# Patient Record
Sex: Female | Born: 1985 | Race: White | Hispanic: No | Marital: Married | Smoking: Never smoker
Health system: Southern US, Community
[De-identification: ages and names within clinical notes are randomized; demographics above are authoritative.]

## PROBLEM LIST (undated history)

## (undated) DIAGNOSIS — F419 Anxiety disorder, unspecified: Secondary | ICD-10-CM

## (undated) DIAGNOSIS — R000 Tachycardia, unspecified: Secondary | ICD-10-CM

## (undated) DIAGNOSIS — J45909 Unspecified asthma, uncomplicated: Secondary | ICD-10-CM

## (undated) DIAGNOSIS — D649 Anemia, unspecified: Secondary | ICD-10-CM

## (undated) DIAGNOSIS — T7840XA Allergy, unspecified, initial encounter: Secondary | ICD-10-CM

## (undated) HISTORY — DX: Unspecified asthma, uncomplicated: J45.909

## (undated) HISTORY — DX: Anxiety disorder, unspecified: F41.9

## (undated) HISTORY — DX: Anemia, unspecified: D64.9

## (undated) HISTORY — DX: Allergy, unspecified, initial encounter: T78.40XA

## (undated) HISTORY — PX: WISDOM TOOTH EXTRACTION: SHX21

---

## 2014-09-29 LAB — OB RESULTS CONSOLE GC/CHLAMYDIA
Chlamydia: NEGATIVE
Gonorrhea: NEGATIVE

## 2014-10-11 ENCOUNTER — Ambulatory Visit (INDEPENDENT_AMBULATORY_CARE_PROVIDER_SITE_OTHER): Payer: BC Managed Care – PPO | Admitting: Family Medicine

## 2014-10-11 ENCOUNTER — Encounter: Payer: Self-pay | Admitting: Family Medicine

## 2014-10-11 ENCOUNTER — Other Ambulatory Visit: Payer: Self-pay | Admitting: Family Medicine

## 2014-10-11 ENCOUNTER — Encounter (INDEPENDENT_AMBULATORY_CARE_PROVIDER_SITE_OTHER): Payer: Self-pay

## 2014-10-11 VITALS — BP 102/64 | HR 138 | Temp 97.8°F | Resp 18 | Ht 60.0 in | Wt 121.9 lb

## 2014-10-11 DIAGNOSIS — I471 Supraventricular tachycardia: Secondary | ICD-10-CM | POA: Diagnosis not present

## 2014-10-11 DIAGNOSIS — J4520 Mild intermittent asthma, uncomplicated: Secondary | ICD-10-CM | POA: Insufficient documentation

## 2014-10-11 DIAGNOSIS — J4530 Mild persistent asthma, uncomplicated: Secondary | ICD-10-CM

## 2014-10-11 DIAGNOSIS — R000 Tachycardia, unspecified: Secondary | ICD-10-CM

## 2014-10-11 DIAGNOSIS — G43009 Migraine without aura, not intractable, without status migrainosus: Secondary | ICD-10-CM | POA: Insufficient documentation

## 2014-10-11 NOTE — Progress Notes (Signed)
Name: Autumn Todd   MRN: [id number]    DOB: 1985/10/10   Date:10/11/2014       Progress Note  Subjective  Chief Complaint  Chief Complaint  Patient presents with  . Tachycardia    patient was seen at her OB/GYN about 2 wks ago and was told she needed an EKG, due to increased heart rate.    HPI  Autumn Todd is a pleasant 28yo female G1P0 at about 10 weeks of her pregnancy who presents today with concerns of asymptomatic tachycardia found during her routine obstetric visit. First trimester lab work has been normal other than no previous immunity to varicella and one other virus. Her tachycardia is not associated with any symptoms. Autumn Todd does not recall any personal or family history of cardiac disease or thyroid disorder. She reports having a normal TSH about 1-2 years ago on routine lab work. She denies chest pain, worsening shortness of breath, dizziness, palpitation sensations, focal neurological deficits. She is weaning off of her Nortryptiline which she uses for chronic headaches and has taken herself off of Dulera and NSAIDs and her OCPs. She uses her PNV, anti-histamine daily. She also has a rescue albuterol inhaler which she has not needed recently. Her pregnancy has been uneventful thus far aside from some first trimester nausea which is resolving.  Patient Active Problem List   Diagnosis Date Noted  . Cephalalgia 10/11/2014  . Asthma, mild persistent 10/11/2014    History  Substance Use Topics  . Smoking status: Never Smoker   . Smokeless tobacco: Not on file  . Alcohol Use: No     Current outpatient prescriptions:  .  albuterol (PROAIR HFA) 108 (90 BASE) MCG/ACT inhaler, Inhale 2 puffs into the lungs every 6 (six) hours as needed., Disp: , Rfl:  .  Doxylamine-Pyridoxine (DICLEGIS PO), Take by mouth., Disp: , Rfl:  .  loratadine (CLARITIN) 10 MG tablet, Take 1 tablet by mouth daily., Disp: , Rfl:  .  mometasone-formoterol (DULERA) 200-5 MCG/ACT AERO, Inhale 2  puffs into the lungs 2 (two) times daily., Disp: , Rfl:  .  PRENATAL 28-0.8 MG TABS, Take 1 tablet by mouth daily., Disp: , Rfl:   Past Surgical History  Procedure Laterality Date  . Wisdom tooth extraction      Family History  Problem Relation Age of Onset  . Cancer Maternal Grandfather     No Known Allergies   Review of Systems  CONSTITUTIONAL: No significant weight changes, fever, chills, weakness or fatigue.  HEENT:  - Eyes: No visual changes.  - Ears: No auditory changes. No pain.  - Nose: No sneezing, congestion, runny nose. - Throat: No sore throat. No changes in swallowing. SKIN: No rash or itching.  CARDIOVASCULAR: No chest pain, chest pressure or chest discomfort. No edema. RESPIRATORY: No shortness of breath, cough or sputum.  GASTROINTESTINAL: No anorexia, nausea, vomiting. No changes in bowel habits. No abdominal pain or blood.  GENITOURINARY: No dysuria. No frequency. No discharge.  NEUROLOGICAL: No headache, dizziness, syncope, paralysis, ataxia, numbness or tingling in the extremities. No memory changes. No change in bowel or bladder control.  MUSCULOSKELETAL: No joint pain. No muscle pain. HEMATOLOGIC: No anemia, bleeding or bruising.  LYMPHATICS: No enlarged lymph nodes.  PSYCHIATRIC: No change in mood. No change in sleep pattern.  ENDOCRINOLOGIC: No reports of sweating, cold or heat intolerance. No polyuria or polydipsia.    Objective  BP 102/64 mmHg  Pulse 138  Temp(Src) 97.8 F (36.6 C) (Oral)  Resp 18  Ht 5' (1.524 m)  Wt 121 lb 14.4 oz (55.293 kg)  BMI 23.81 kg/m2  SpO2 98%  LMP 08/03/2014 Body mass index is 23.81 kg/(m^2).  Physical Exam  Constitutional: Patient appears well-developed and well-nourished. In no distress.  HEENT:  - Head: Normocephalic and atraumatic.  - Ears: Bilateral TMs gray, no erythema or effusion - Nose: Nasal mucosa moist - Mouth/Throat: Oropharynx is clear and moist. No tonsillar hypertrophy or erythema. No post  nasal drainage.  - Eyes: Conjunctivae clear, EOM movements normal. PERRLA. No scleral icterus. No exophthalamos.  Neck: Normal range of motion. Neck supple. No JVD present. No thyromegaly present.  Cardiovascular: Sinus tachycardia without murmur heard.  Pulmonary/Chest: Effort normal and breath sounds at baseline although she does have some end expiratory rhonchi in mid to upper lung fields. No respiratory distress. Musculoskeletal: Normal range of motion bilateral UE and LE, no joint effusions. Peripheral vascular: Bilateral LE no edema. Neurological: CN II-XII grossly intact with no focal deficits. Alert and oriented to person, place, and time. Coordination, balance, strength, speech and gait are normal.  Skin: Skin is warm and dry. No rash noted. No erythema.  Psychiatric: Patient has a normal mood and affect. Behavior is normal in office today. Judgment and thought content normal in office today.   Assessment & Plan  1. Sinus tachycardia Autumn Todd was seen in my clinic on 10/11/2014 due to asymptomatic tachycardia in first trimester of pregnancy. An EKG was done in the office that showed sinus tachycardia with no acute abnormal findings. Review of vital signs within the past year indicates a heart rate range of 70-120, higher during acute illnesses. Routine first trimester lab work does not reveal anemia or other potential causative etiologies of her sinus tachycardia. Physical exam does not suggest thyroid disease or other occult etiologies although sinus tachycardia may or may not be related to her underlying asthma disease. Overall we discussed close monitoring for any concerning symptoms that may develop as she progresses through her pregnancy. No further work up is needed at this time.   - EKG 12-Lead

## 2014-10-21 ENCOUNTER — Other Ambulatory Visit: Payer: Self-pay | Admitting: Family Medicine

## 2014-11-04 DIAGNOSIS — R000 Tachycardia, unspecified: Secondary | ICD-10-CM | POA: Insufficient documentation

## 2014-11-04 DIAGNOSIS — I4711 Inappropriate sinus tachycardia, so stated: Secondary | ICD-10-CM | POA: Insufficient documentation

## 2014-11-14 ENCOUNTER — Encounter: Payer: Self-pay | Admitting: Family Medicine

## 2014-11-14 ENCOUNTER — Ambulatory Visit (INDEPENDENT_AMBULATORY_CARE_PROVIDER_SITE_OTHER): Payer: BC Managed Care – PPO | Admitting: Family Medicine

## 2014-11-14 ENCOUNTER — Other Ambulatory Visit: Payer: Self-pay | Admitting: Family Medicine

## 2014-11-14 VITALS — BP 100/68 | HR 129 | Temp 97.6°F | Resp 20 | Ht 60.0 in | Wt 127.0 lb

## 2014-11-14 DIAGNOSIS — J4531 Mild persistent asthma with (acute) exacerbation: Secondary | ICD-10-CM

## 2014-11-14 MED ORDER — BUDESONIDE-FORMOTEROL FUMARATE 160-4.5 MCG/ACT IN AERO: 2.0000 | INHALATION_SPRAY | Freq: Two times a day (BID) | RESPIRATORY_TRACT | Status: DC

## 2014-11-14 MED ORDER — PREDNISONE 10 MG PO TABS: 10.0000 mg | ORAL_TABLET | Freq: Two times a day (BID) | ORAL | Status: DC

## 2014-11-14 NOTE — Patient Instructions (Signed)

## 2014-11-14 NOTE — Progress Notes (Signed)
Name: Autumn Todd   MRN: [id number]    DOB: April 21, 1985   Date:11/14/2014       Progress Note  Subjective  Chief Complaint  Chief Complaint  Patient presents with  . Asthma    patient stated that she has been having some shortness of breath.   . Cough    HPI  Autumn Todd is a pleasant 29 year old female with a known history of Asthma mild persistent who is here today with concerns of uncontrolled asthma since her pregnancy. Symptoms include shortness of breath and cough. Due date May 10, 2015 and prior to this was maintaining control of her Asthma on prn inhaled albuterol.  Prior to that was on [facility] for maintenance but had not needed that for several months prior to becoming pregnant. Recently she had a upper URI and has had more shortness of breath and dry cough ever since. She was seen by her obstetrician who took her off of the [facility] and started on budesodine which she reports is not alleviating her symptoms. Not associated with fevers, chills, productive cough, chest pain, worsening LE edema.   Patient Active Problem List   Diagnosis Date Noted  . 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 Migraine without aura and without status migrainosus, not intractable 10/11/2014  . Asthma, mild persistent 10/11/2014  . Sinus tachycardia 10/11/2014    History  Substance Use Topics  . Smoking status: Never Smoker   . Smokeless tobacco: Not on file  . Alcohol Use: No     Current outpatient prescriptions:  .  albuterol (PROAIR HFA) 108 (90 BASE) MCG/ACT inhaler, Inhale 2 puffs into the lungs every 6 (six) hours as needed., Disp: , Rfl:  .  budesonide-formoterol (SYMBICORT) 160-4.5 MCG/ACT inhaler, Inhale 2 puffs into the lungs 2 (two) times daily., Disp: 1 Inhaler, Rfl: 1 .  DICLEGIS 10-10 MG TBEC, , Disp: , Rfl: 1 .  loratadine (CLARITIN) 10 MG tablet, Take 1 tablet by mouth daily., Disp: , Rfl:  .  predniSONE (DELTASONE) 10 MG tablet, Take 1 tablet (10 mg total)  by mouth 2 (two) times daily with a meal., Disp: 12 tablet, Rfl: 0 .  PRENATAL 28-0.8 MG TABS, Take 1 tablet by mouth daily., Disp: , Rfl:  .  PULMICORT FLEXHALER 90 MCG/ACT inhaler, Inhale 2 puffs into the lungs 2 (two) times daily., Disp: , Rfl: 0  Past Surgical History  Procedure Laterality Date  . Wisdom tooth extraction      Family History  Problem Relation Age of Onset  . Cancer Maternal Grandfather     No Known Allergies   Review of Systems  CONSTITUTIONAL: No significant weight changes, fever, chills, weakness or fatigue.  HEENT:  - Eyes: No visual changes.  - Ears: No auditory changes. No pain.  - Nose: No sneezing, congestion, runny nose. - Throat: No sore throat. No changes in swallowing. SKIN: No rash or itching.  CARDIOVASCULAR: No chest pain, chest pressure or chest discomfort. No palpitations or edema.  RESPIRATORY: Yes cough and shortness of breath. GASTROINTESTINAL: No anorexia, nausea, vomiting. No changes in bowel habits. No abdominal pain or blood.  GENITOURINARY: No dysuria. No frequency. No discharge.  NEUROLOGICAL: No headache, dizziness, syncope, paralysis, ataxia, numbness or tingling in the extremities. No memory changes. No change in bowel or bladder control.  MUSCULOSKELETAL: No joint pain. No muscle pain. HEMATOLOGIC: No anemia, bleeding or bruising.  LYMPHATICS: No enlarged lymph nodes.  PSYCHIATRIC: No change in mood. No change in  sleep pattern.  ENDOCRINOLOGIC: No reports of sweating, cold or heat intolerance. No polyuria or polydipsia.     Objective  BP 100/68 mmHg  Pulse 129  Temp(Src) 97.6 F (36.4 C) (Oral)  Resp 20  Ht 5' (1.524 m)  Wt 127 lb (57.607 kg)  BMI 24.80 kg/m2  SpO2 96%  LMP 08/03/2014 Body mass index is 24.8 kg/(m^2).  Physical Exam  Constitutional: Patient appears well-developed and well-nourished. In no distress.  HEENT:  - Head: Normocephalic and atraumatic.  - Ears: Bilateral TMs gray, no erythema or  effusion - Nose: Nasal mucosa moist - Mouth/Throat: Oropharynx is clear and moist. No tonsillar hypertrophy or erythema. No post nasal drainage.  - Eyes: Conjunctivae clear, EOM movements normal. PERRLA. No scleral icterus.  Neck: Normal range of motion. Neck supple. No JVD present. No thyromegaly present.  Cardiovascular: Normal rate, regular rhythm and normal heart sounds.  No murmur heard.  Pulmonary/Chest: Effort normal and breath sounds mildly decreased. No respiratory distress. Abdomen: Gravida abdomen Musculoskeletal: Normal range of motion bilateral UE and LE, no joint effusions. Peripheral vascular: Bilateral LE no edema. Neurological: CN II-XII grossly intact with no focal deficits. Alert and oriented to person, place, and time. Coordination, balance, strength, speech and gait are normal.  Skin: Skin is warm and dry. No rash noted. No erythema.  Psychiatric: Patient has a normal mood and affect. Behavior is normal in office today. Judgment and thought content normal in office today.    Assessment & Plan  1. Asthma, mild persistent, with acute exacerbation Vitals stable, pulse ox reassuring, lungs sound fairly clear questionable tightness. Category C medications, risks and benefits discussed with patient and she has decided on moving forward with using prednisone in efforts to get a better handle of her asthma symptoms. She will continue Pulmicort alone for now but if symptoms do not improve she will switch to Symbicort.   - budesonide-formoterol (SYMBICORT) 160-4.5 MCG/ACT inhaler; Inhale 2 puffs into the lungs 2 (two) times daily.  Dispense: 1 Inhaler; Refill: 1 - predniSONE (DELTASONE) 10 MG tablet; Take 1 tablet (10 mg total) by mouth 2 (two) times daily with a meal.  Dispense: 12 tablet; Refill: 0

## 2015-02-09 LAB — OB RESULTS CONSOLE RPR: RPR: NONREACTIVE

## 2015-02-17 ENCOUNTER — Encounter: Payer: Self-pay | Admitting: Family Medicine

## 2015-02-17 ENCOUNTER — Ambulatory Visit (INDEPENDENT_AMBULATORY_CARE_PROVIDER_SITE_OTHER): Payer: BC Managed Care – PPO | Admitting: Family Medicine

## 2015-02-17 VITALS — BP 112/68 | HR 125 | Temp 97.5°F | Resp 20 | Wt 141.9 lb

## 2015-02-17 DIAGNOSIS — J0100 Acute maxillary sinusitis, unspecified: Secondary | ICD-10-CM | POA: Diagnosis not present

## 2015-02-17 MED ORDER — PREDNISONE 10 MG PO TABS: 10.0000 mg | ORAL_TABLET | Freq: Two times a day (BID) | ORAL | Status: DC

## 2015-02-17 MED ORDER — AMOXICILLIN 875 MG PO TABS: 875.0000 mg | ORAL_TABLET | Freq: Two times a day (BID) | ORAL | Status: DC

## 2015-02-17 NOTE — Progress Notes (Signed)
Name: Autumn Todd   MRN: [id number]    DOB: 06-Nov-1985   Date:02/17/2015       Progress Note  Subjective  Chief Complaint  Chief Complaint  Patient presents with  . Sinusitis    HPI  Patient is here today with concerns regarding the following symptoms congestion, sneezing, sinus pressure and non productive cough that started 3 days ago.  Associated with fatigue. Has tried the following home remedies: patient has taken her allergy medication and is currently using her maintenance inhaler, Pulmicort. Has not needed rescue inhaler as she has not started having wheezing or chest tightness. She has had sinus and nasal symptoms for 3 days only but wanted to have something at her pharmacy in case her symptoms progress. She is in the early part of her 3rd trimester of her first pregnancy.   Past Medical History  Diagnosis Date  . Allergy   . Asthma   . Dizziness     Social History  Substance Use Topics  . Smoking status: Never Smoker   . Smokeless tobacco: Not on file  . Alcohol Use: No     Current outpatient prescriptions:  .  DICLEGIS 10-10 MG TBEC, , Disp: , Rfl: 1 .  loratadine (CLARITIN) 10 MG tablet, Take 1 tablet by mouth daily., Disp: , Rfl:  .  PRENATAL 28-0.8 MG TABS, Take 1 tablet by mouth daily., Disp: , Rfl:  .  PULMICORT FLEXHALER 90 MCG/ACT inhaler, Inhale 2 puffs into the lungs 2 (two) times daily., Disp: , Rfl: 0 .  albuterol (PROAIR HFA) 108 (90 BASE) MCG/ACT inhaler, Inhale 2 puffs into the lungs every 6 (six) hours as needed., Disp: , Rfl:   No Known Allergies  ROS  Positive for fatigue, nasal congestion, sinus pressure, ear fullness, cough as mentioned in HPI, otherwise all systems reviewed and are negative.  Objective  Filed Vitals:   02/17/15 1608  Pulse: 125  Temp: 97.5 F (36.4 C)  TempSrc: Oral  Resp: 18  Weight: 141 lb 14.4 oz (64.365 kg)  SpO2: 97%   Body mass index is 27.71 kg/(m^2).   Physical Exam  Constitutional: Patient  appears well-developed and well-nourished. In no acute distress. Is gravida, looks well. HEENT:  - Head: Normocephalic and atraumatic.  - Ears: RIGHT TM bulging with minimal clear exudate, LEFT TM bulging with minimal clear exudate.  - Nose: Nasal mucosa boggy and congested.  - Mouth/Throat: Oropharynx is moist with slight erythema of bilateral tonsils without hypertrophy or exudates. Post nasal drainage present.  - Eyes: Conjunctivae clear, EOM movements normal. PERRLA. No scleral icterus.  Neck: Normal range of motion. Neck supple. No JVD present. No thyromegaly present. No local lymphadenopathy. Cardiovascular: Regular rate, regular rhythm with no murmurs heard.  Pulmonary/Chest: Effort normal and breath sounds clear in all lung fields.  Psychiatric: Patient has a normal mood and affect. Behavior is normal in office today. Judgment and thought content normal in office today.   Assessment & Plan  1. Acute maxillary sinusitis, recurrence not specified Etiologies include allergic rhinitis with superimposed viral infection at this time. Instructed patient on increasing hydration, nasal saline spray, steam inhalation, Tylenol if tolerated and not contraindicated. If symptoms persist/worsen may consider antibiotic therapy. Use prednisone only if having asthma attack, we discussed risks vs benefits during her pregnancy.   - predniSONE (DELTASONE) 10 MG tablet; Take 1 tablet (10 mg total) by mouth 2 (two) times daily with a meal.  Dispense: 12 tablet; Refill: 0 - amoxicillin (AMOXIL)  875 MG tablet; Take 1 tablet (875 mg total) by mouth 2 (two) times daily.  Dispense: 20 tablet; Refill: 0

## 2015-04-11 LAB — OB RESULTS CONSOLE HIV ANTIBODY (ROUTINE TESTING): HIV: NONREACTIVE

## 2015-04-12 LAB — OB RESULTS CONSOLE GBS: STREP GROUP B AG: NEGATIVE

## 2015-04-27 ENCOUNTER — Inpatient Hospital Stay: Payer: BC Managed Care – PPO | Admitting: Certified Registered Nurse Anesthetist

## 2015-04-27 ENCOUNTER — Encounter: Payer: Self-pay | Admitting: *Deleted

## 2015-04-27 ENCOUNTER — Observation Stay: Payer: BC Managed Care – PPO

## 2015-04-27 ENCOUNTER — Inpatient Hospital Stay
Admission: RE | Admit: 2015-04-27 | Discharge: 2015-04-30 | DRG: 765 | Disposition: A | Discharge status: Home / Self Care | Payer: BC Managed Care – PPO | Source: Intra-hospital | Attending: Obstetrics and Gynecology | Admitting: Obstetrics and Gynecology

## 2015-04-27 ENCOUNTER — Encounter
Admission: RE | Disposition: A | Discharge status: Home / Self Care | Payer: Self-pay | Attending: Obstetrics and Gynecology

## 2015-04-27 DIAGNOSIS — Z3A38 38 weeks gestation of pregnancy: Secondary | ICD-10-CM

## 2015-04-27 DIAGNOSIS — O4103X0 Oligohydramnios, third trimester, not applicable or unspecified: Secondary | ICD-10-CM | POA: Diagnosis present

## 2015-04-27 DIAGNOSIS — O321XX0 Maternal care for breech presentation, not applicable or unspecified: Principal | ICD-10-CM | POA: Diagnosis present

## 2015-04-27 DIAGNOSIS — O9952 Diseases of the respiratory system complicating childbirth: Secondary | ICD-10-CM | POA: Diagnosis present

## 2015-04-27 DIAGNOSIS — O329XX0 Maternal care for malpresentation of fetus, unspecified, not applicable or unspecified: Secondary | ICD-10-CM | POA: Diagnosis present

## 2015-04-27 DIAGNOSIS — J45909 Unspecified asthma, uncomplicated: Secondary | ICD-10-CM | POA: Diagnosis present

## 2015-04-27 HISTORY — DX: Tachycardia, unspecified: R00.0

## 2015-04-27 LAB — CBC
HCT: 38.2 % (ref 35.0–47.0)
Hemoglobin: 12.4 g/dL (ref 12.0–16.0)
MCH: 29 pg (ref 26.0–34.0)
MCHC: 32.5 g/dL (ref 32.0–36.0)
MCV: 89.3 fL (ref 80.0–100.0)
PLATELETS: 268 10*3/uL (ref 150–440)
RBC: 4.28 MIL/uL (ref 3.80–5.20)
RDW: 13.9 % (ref 11.5–14.5)
WBC: 12.6 10*3/uL — AB (ref 3.6–11.0)

## 2015-04-27 LAB — TYPE AND SCREEN
ABO/RH(D): O POS
ANTIBODY SCREEN: NEGATIVE

## 2015-04-27 LAB — ABO/RH: ABO/RH(D): O POS

## 2015-04-27 SURGERY — Surgical Case
Anesthesia: Epidural

## 2015-04-27 MED ORDER — MEASLES, MUMPS & RUBELLA VAC ~~LOC~~ INJ
0.5000 mL | INJECTION | Freq: Once | SUBCUTANEOUS | Status: AC
  Administered 2015-04-30: 0.5 mL via SUBCUTANEOUS
  Filled 2015-04-27: qty 0.5

## 2015-04-27 MED ORDER — LIDOCAINE HCL (PF) 1 % IJ SOLN: 30.0000 mL | INTRAMUSCULAR | Status: DC | PRN

## 2015-04-27 MED ORDER — OXYTOCIN 40 UNITS IN LACTATED RINGERS INFUSION - SIMPLE MED
INTRAVENOUS | Status: AC
  Filled 2015-04-27: qty 1000

## 2015-04-27 MED ORDER — LIDOCAINE HCL (PF) 2 % IJ SOLN
INTRAMUSCULAR | Status: DC | PRN
  Administered 2015-04-27 (×8): 5 mL via EPIDURAL

## 2015-04-27 MED ORDER — FLUTICASONE PROPIONATE HFA 44 MCG/ACT IN AERO
2.0000 | INHALATION_SPRAY | Freq: Two times a day (BID) | RESPIRATORY_TRACT | Status: DC
  Filled 2015-04-27: qty 10.6

## 2015-04-27 MED ORDER — OXYCODONE HCL 5 MG PO TABS: 5.0000 mg | ORAL_TABLET | Freq: Once | ORAL | Status: DC | PRN

## 2015-04-27 MED ORDER — LACTATED RINGERS IV SOLN
INTRAVENOUS | Status: DC
  Administered 2015-04-27: 21:00:00 via INTRAVENOUS

## 2015-04-27 MED ORDER — CITRIC ACID-SODIUM CITRATE 334-500 MG/5ML PO SOLN: 30.0000 mL | ORAL | Status: DC

## 2015-04-27 MED ORDER — CITRIC ACID-SODIUM CITRATE 334-500 MG/5ML PO SOLN
30.0000 mL | ORAL | Status: DC | PRN
  Filled 2015-04-27: qty 15

## 2015-04-27 MED ORDER — CEFAZOLIN SODIUM-DEXTROSE 2-3 GM-% IV SOLR
2.0000 g | INTRAVENOUS | Status: AC
  Administered 2015-04-27: 2 g via INTRAVENOUS
  Filled 2015-04-27: qty 50

## 2015-04-27 MED ORDER — CEFAZOLIN SODIUM-DEXTROSE 2-3 GM-% IV SOLR
INTRAVENOUS | Status: DC | PRN
  Administered 2015-04-27: 2 g via INTRAVENOUS

## 2015-04-27 MED ORDER — ACETAMINOPHEN 325 MG PO TABS: 650.0000 mg | ORAL_TABLET | ORAL | Status: DC | PRN

## 2015-04-27 MED ORDER — OXYTOCIN 40 UNITS IN LACTATED RINGERS INFUSION - SIMPLE MED
INTRAVENOUS | Status: DC | PRN
  Administered 2015-04-27: 1000 mL via INTRAVENOUS

## 2015-04-27 MED ORDER — PHENYLEPHRINE HCL 10 MG/ML IJ SOLN
INTRAMUSCULAR | Status: DC | PRN
  Administered 2015-04-27 (×3): 100 ug via INTRAVENOUS

## 2015-04-27 MED ORDER — CEFAZOLIN SODIUM-DEXTROSE 2-3 GM-% IV SOLR
INTRAVENOUS | Status: AC
  Filled 2015-04-27: qty 50

## 2015-04-27 MED ORDER — FENTANYL CITRATE (PF) 100 MCG/2ML IJ SOLN
25.0000 ug | INTRAMUSCULAR | Status: DC | PRN
  Administered 2015-04-28: 50 ug via INTRAVENOUS

## 2015-04-27 MED ORDER — OXYCODONE HCL 5 MG/5ML PO SOLN: 5.0000 mg | Freq: Once | ORAL | Status: DC | PRN

## 2015-04-27 SURGICAL SUPPLY — 28 items
BENZOIN TINCTURE PRP APPL 2/3 (GAUZE/BANDAGES/DRESSINGS) ×3 IMPLANT
CANISTER SUCT 3000ML (MISCELLANEOUS) ×3 IMPLANT
CHLORAPREP W/TINT 26ML (MISCELLANEOUS) ×6 IMPLANT
CLOSURE WOUND 1/2 X4 (GAUZE/BANDAGES/DRESSINGS) ×1
DRSG TEGADERM 8X12 (GAUZE/BANDAGES/DRESSINGS) ×3 IMPLANT
DRSG TELFA 3X8 NADH (GAUZE/BANDAGES/DRESSINGS) ×3 IMPLANT
ELECT CAUTERY BLADE 6.4 (BLADE) ×3 IMPLANT
GAUZE SPONGE 4X4 12PLY STRL (GAUZE/BANDAGES/DRESSINGS) ×3 IMPLANT
GLOVE BIO SURGEON STRL SZ7 (GLOVE) ×9 IMPLANT
GLOVE INDICATOR 7.5 STRL GRN (GLOVE) ×9 IMPLANT
GOWN STRL REUS W/ TWL LRG LVL3 (GOWN DISPOSABLE) ×1 IMPLANT
GOWN STRL REUS W/ TWL XL LVL3 (GOWN DISPOSABLE) ×2 IMPLANT
GOWN STRL REUS W/TWL LRG LVL3 (GOWN DISPOSABLE) ×2
GOWN STRL REUS W/TWL XL LVL3 (GOWN DISPOSABLE) ×4
LIQUID BAND (GAUZE/BANDAGES/DRESSINGS) ×3 IMPLANT
NS IRRIG 1000ML POUR BTL (IV SOLUTION) ×3 IMPLANT
PACK C SECTION AR (MISCELLANEOUS) ×3 IMPLANT
PAD GROUND ADULT SPLIT (MISCELLANEOUS) ×3 IMPLANT
PAD OB MATERNITY 4.3X12.25 (PERSONAL CARE ITEMS) ×3 IMPLANT
PAD PREP 24X41 OB/GYN DISP (PERSONAL CARE ITEMS) ×3 IMPLANT
STRIP CLOSURE SKIN 1/2X4 (GAUZE/BANDAGES/DRESSINGS) ×2 IMPLANT
SUT MAXON ABS #0 GS21 30IN (SUTURE) ×3 IMPLANT
SUT PLAIN 2 0 (SUTURE) ×4
SUT PLAIN ABS 2-0 CT1 27XMFL (SUTURE) ×2 IMPLANT
SUT VIC AB 1 CT1 36 (SUTURE) ×9 IMPLANT
SUT VIC AB 2-0 CT1 36 (SUTURE) ×3 IMPLANT
SUT VIC AB 4-0 FS2 27 (SUTURE) ×3 IMPLANT
SYRINGE 10CC LL (SYRINGE) ×3 IMPLANT

## 2015-04-27 NOTE — Anesthesia Preprocedure Evaluation (Signed)
Anesthesia Evaluation  Patient identified by MRN, date of birth, ID band Patient awake    Reviewed: Allergy & Precautions, H&P , NPO status , Patient's Chart, lab work & pertinent test results  History of Anesthesia Complications Negative for: history of anesthetic complications  Airway Mallampati: III  TM Distance: >3 FB Neck ROM: full    Dental no notable dental hx. (+) Teeth Intact   Pulmonary neg shortness of breath, asthma ,    Pulmonary exam normal breath sounds clear to auscultation       Cardiovascular Exercise Tolerance: Good (-) hypertensionnegative cardio ROS Normal cardiovascular exam Rhythm:regular Rate:Normal     Neuro/Psych  Headaches, negative psych ROS   GI/Hepatic negative GI ROS, Neg liver ROS,   Endo/Other  negative endocrine ROS  Renal/GU negative Renal ROS  negative genitourinary   Musculoskeletal   Abdominal   Peds  Hematology negative hematology ROS (+)   Anesthesia Other Findings Past Medical History:   Allergy                                                      Asthma                                                       Dizziness                                                    Sinus tachycardia (HCC)                                      Migraine                                                    Past Surgical History:   WISDOM TOOTH EXTRACTION                                      BMI    Body Mass Index   30.07 kg/m 2      Reproductive/Obstetrics (+) Pregnancy                             Anesthesia Physical Anesthesia Plan  ASA: III  Anesthesia Plan: Spinal and Epidural   Post-op Pain Management:    Induction:   Airway Management Planned:   Additional Equipment:   Intra-op Plan:   Post-operative Plan:   Informed Consent: I have reviewed the patients History and Physical, chart, labs and discussed the procedure including the risks,  benefits and alternatives for the proposed anesthesia with the patient or authorized representative who has indicated his/her understanding and acceptance.   Dental Advisory Given  Plan  Discussed with: Anesthesiologist, CRNA and Surgeon  Anesthesia Plan Comments:         Anesthesia Quick Evaluation

## 2015-04-27 NOTE — OB Triage Note (Signed)
G1P0 pt sent from office for U/S and NST

## 2015-04-27 NOTE — Discharge Summary (Signed)
Obstetrical Discharge Summary  Date of Admission: 04/27/2015 Date of Discharge: 04/30/2015  Primary OB: [facility]  Gestational Age at Delivery: [id number]   Antepartum complications: None Reason for Admission: Newly diagnosed oligohydramnios and breech presentation at routine OB visit Date of Delivery: 04/30/2015  Delivered By: Cornelia Copaharlie Pickens, Jr MD Delivery Type: primary cesarean section, low transverse incision Intrapartum complications/course: None Anesthesia: CSE Placenta: Delivered and expressed via active management. Intact: yes. To pathology: yes.  Laceration: n/a Episiotomy: none Baby: Liveborn female, APGARs8/9, weight 2740 g.   Postpartum course: routine care following cesarean section Discharge Vital Signs:  Current Vital Signs 24h Vital Sign Ranges  T 98.1 F (36.7 C) Temp  Avg: 98.1 F (36.7 C)  Min: 97.8 F (36.6 C)  Max: 98.6 F (37 C)  BP 105/66 mmHg BP  Min: 105/66  Max: 108/61  HR 91 Pulse  Avg: 96  Min: 91  Max: 101  RR 16 Resp  Avg: 17  Min: 16  Max: 18  SaO2 98 % Not Delivered SpO2  Avg: 98.5 %  Min: 98 %  Max: 99 %       24 Hour I/O Current Shift I/O  Time Ins Outs        Patient Vitals for the past 6 hrs:  BP Temp Temp src Pulse Resp SpO2  04/30/15 0755 105/66 mmHg 98.1 F (36.7 C) Oral 91 16 98 %    Discharge Exam:  NAD Perineum: intact Abdomen: firm fundus below the umbilicus. Incision c/d/i with no signs/symptoms of infection.  Heart: RRR no MRGs Lungs: CTAB Ext: no evidence of DVT  Disposition: Home  Rh Immune globulin given: not applicable Rubella vaccine given: ordered Tdap vaccine given in AP or PP setting: yes Flu vaccine given in AP or PP setting: yes  Contraception: [place]  Prenatal/Postnatal Panel: O POS//Rubella Not immune//Varicella Not immune//RPR neg//HIV negative/HepB Surface Ag negative//pap no abnormalities (date: 2016)/Plan breastfeeding  Plan:  Autumn Todd was discharged to home in good condition. Follow-up  appointment with Dr. Vergie LivingPickens in 1 week for an incision check  Discharge Medications:   Medication List    STOP taking these medications        amoxicillin 875 MG tablet  Commonly known as:  AMOXIL     DICLEGIS 10-10 MG Tbec  Generic drug:  Doxylamine-Pyridoxine     loratadine 10 MG tablet  Commonly known as:  CLARITIN     predniSONE 10 MG tablet  Commonly known as:  DELTASONE      TAKE these medications        ibuprofen 600 MG tablet  Commonly known as:  ADVIL,MOTRIN  Take 1 tablet (600 mg total) by mouth every 6 (six) hours as needed for fever, headache, mild pain or cramping.     oxyCODONE 5 MG immediate release tablet  Commonly known as:  Oxy IR/ROXICODONE  Take 1 tablet (5 mg total) by mouth every 4 (four) hours as needed (pain scale 4-7).     PRENATAL 28-0.8 MG Tabs  Take 1 tablet by mouth daily.     PROAIR HFA 108 (90 Base) MCG/ACT inhaler  Generic drug:  albuterol  Inhale 2 puffs into the lungs every 6 (six) hours as needed.     PULMICORT FLEXHALER 90 MCG/ACT inhaler  Generic drug:  Budesonide  Inhale 2 puffs into the lungs 2 (two) times daily.         Tresea MallGLEDHILL,Dharma Pare, CNM   This patient and plan were discussed with Dr Elesa MassedWard 04/30/2015

## 2015-04-27 NOTE — Op Note (Addendum)
Operative Note   SURGERY DATE: 04/27/2015  PRE-OP DIAGNOSIS:  *Intrauterine pregnancy @ 38/1 *Oligohydramnios (AFI 4) *Malpresentation  POST-OP DIAGNOSIS: Same   PROCEDURE: primary low transverse cesarean section via pfannenstiel skin incision with double layer uterine closure  SURGEON: Surgeon(s) and Role:    * Elmira Bingharlie Dejon Lukas, MD - Primary  ASSISTANT: None  ANESTHESIA: CSE  ESTIMATED BLOOD LOSS: 600mL  DRAINS: 250mL UOP via indwelling foley  TOTAL IV FLUIDS: 1100mL crystalloid  VTE Prophylaxis: SCDs to bilateral lower extremities  ANTIBIOTICS: Two grams of Cefazolin were given., within 1 hour of skin incision. This was the 2nd dose due to long time obtaining anesthesia and 1st dose had just past the hour mark  SPECIMENS: placenta to pathology  COMPLICATIONS: none  FINDINGS: No intra-abdominal adhesions were noted. Grossly normal uterus, tubes and ovaries. clear amniotic fluid, complete breech, female infant, weight 2239gm, APGARs 8/9, intact placenta.  PROCEDURE IN DETAIL: The patient was taken to the operating room where anesthesia was administered and normal fetal heart tones were confirmed; this took longer than usual and fetal heart tones were checked approximately q5165m and they were always normal. She was then prepped and draped in the normal fashion in the dorsal supine position with a leftward tilt.  After a time out was performed, a pfannensteil  skin incision was made with the scalpel and carried through to the underlying layer of fascia. The fascia was then incised at the midline and this incision was extended laterally with the mayo scissors. Attention was turned to the superior aspect of the fascial incision which was grasped with the kocher clamps x 2, tented up and the rectus muscles were dissected off with the bovie. In a similar fashion the inferior aspect of the fascial incision was grasped with the kocher clamps, tented up and the rectus muscles dissected off  with the mayo scissors. The rectus muscles were then separated in the midline and the peritoneum was entered bluntly. The bladder blade was inserted and the vesicouterine peritoneum was identified, tented up and entered with the metzenbaum scissors. This incision was extended laterally and the bladder flap was created digitally. The bladder blade was reinserted.  A low transverse hysterotomy was made with the scalpel until the endometrial cavity was breached and the amniotic sac ruptured with the Allis clamp, yielding clear amniotic fluid. This incision was extended bluntly and then the lower extremities were grasped and brought through the incision via the Pinard maneuver, while rotating the sacrum anteriorly. Next, each shoulder was then brought through the incision and the body then wrapped in a moist towel. Using the Marceau-Smelli-Veit maneuver, the infants head was easily delivered. The cord was clamped x 2 and cut, and the infant was handed to the awaiting pediatricians and delayed cord clamping was not done. She was a [place] cord blood donor and this was obtained.  The placenta was then gradually expressed from the uterus and then the uterus was exteriorized and cleared of all clots and debris. The hysterotomy was repaired with a running suture of 1-0 monocryl. A second imbricating layer of 1-0 monocryl suture was then placed to achieve excellent hemostasis.   The uterus and adnexa were then returned to the abdomen, and the hysterotomy and all operative sites were reinspected and excellent hemostasis was noted after irrigation and suction of the abdomen with warm saline.  The peritoneum was closed with a running stitch of 3-0 vicryl. The fascia was reapproximated with 0 vicryl in a simple running fashion bilaterally. The  subcutaneous layer was then reapproximated with interrupted sutures of 2-0 plain gut, and the skin was then closed with 4-0 monocryl, in a subcuticular fashion and dermabond  The  patient  tolerated the procedure well. Sponge, lap, needle, and instrument counts were correct x 2. The patient was transferred to the recovery room awake, alert and breathing independently in stable condition.  Cornelia Copa MD [facility] OBGYN Pager [phone]

## 2015-04-27 NOTE — Transfer of Care (Signed)
Immediate Anesthesia Transfer of Care Note  Patient: Autumn DrownHeather Todd  Procedure(s) Performed: Procedure(s): CESAREAN SECTION (N/A)  Patient Location: PACU  Anesthesia Type:Epidural  Level of Consciousness: awake, alert , oriented and patient cooperative  Airway & Oxygen Therapy: Patient Spontanous Breathing  Post-op Assessment: Report given to RN and Post -op Vital signs reviewed and stable  Post vital signs: Reviewed and stable  Last Vitals:  Filed Vitals:   04/27/15 1915 04/27/15 2343  BP: 122/69 122/63  Pulse: 97 104  Temp: 36.7 C 36.7 C  Resp:  16    Complications: No apparent anesthesia complications

## 2015-04-27 NOTE — Anesthesia Procedure Notes (Signed)
Epidural Patient location during procedure: OB Start time: 04/27/2015 10:05 PM End time: 04/27/2015 10:09 PM  Staffing Anesthesiologist: Margorie JohnPISCITELLO, JOSEPH K Performed by: anesthesiologist   Preanesthetic Checklist Completed: patient identified, site marked, surgical consent, pre-op evaluation, timeout performed, IV checked, risks and benefits discussed and monitors and equipment checked  Epidural Patient position: sitting Prep: Betadine Patient monitoring: heart rate, continuous pulse ox and blood pressure Approach: midline Location: L4-L5 Injection technique: LOR saline  Needle:  Needle type: Tuohy  Needle gauge: 18 G Needle length: 9 cm and 9 Needle insertion depth: 5 cm Catheter type: closed end flexible Catheter size: 20 Guage Catheter at skin depth: 9 cm Test dose: negative and 1.5% lidocaine with Epi 1:200 K  Assessment Sensory level: T6 Events: blood not aspirated, injection not painful, no injection resistance, negative IV test and no paresthesia  Additional Notes   Patient tolerated the insertion well without complications.Reason for block:procedure for pain

## 2015-04-27 NOTE — Discharge Instructions (Addendum)
° °Cesarean Delivery, Care After °Refer to this sheet in the next few weeks. These instructions provide you with information on caring for yourself after your procedure. Your health care provider may also give you specific instructions. Your treatment has been planned according to current medical practices, but problems sometimes occur. Call your health care provider if you have any problems or questions after you go home. °HOME CARE INSTRUCTIONS  °· If you have an On-Q pump, remove it on the 5th day after your surgery, by removing the dressing/bandage and pulling the pump out. Cover the site where the pump strings came out with a band-aid, as needed. °· Only take over-the-counter or prescription medications as directed by your health care provider. °· Do not drink alcohol, especially if you are breastfeeding or taking medication to relieve pain. °· Do not  smoke tobacco. °· Continue to use good perineal care. Good perineal care includes: °¨ Wiping your perineum from front to back. °¨ Keeping your perineum clean. °· Check your surgical cut (incision) daily for increased redness, drainage, swelling, or separation of skin. °· Shower and clean your incision gently with soap and water every day, by letting warm and soapy water run over the incision, and then pat it dry. If your health care provider says it is okay, leave the incision uncovered. Use a bandage (dressing) if the incision is draining fluid or appears irritated. If the adhesive strips across the incision do not fall off within 7 days, carefully peel them off, after a shower. °· Hug a pillow when coughing or sneezing until your incision is healed. This helps to relieve pain. °· Do not use tampons, douches or have sexual intercourse, until your health care provider says it is okay. °· Wear a well-fitting bra that provides breast support. °· Limit wearing support panties or control-top hose. °· Drink enough fluids to keep your urine clear or pale  yellow. °· Eat high-fiber foods such as whole grain cereals and breads, brown rice, beans, and fresh fruits and vegetables every day. These foods may help prevent or relieve constipation. °· Resume activities such as climbing stairs, driving, lifting, exercising, or traveling as directed by your health care provider. °· Try to have someone help you with your household activities and your newborn for at least a few days after you leave the hospital. °· Rest as much as possible. Try to rest or take a nap when your newborn is sleeping. °· Increase your activities gradually. °· Do not lift more than 15lbs until directed by a provider. °· Keep all of your scheduled postpartum appointments. It is very important to keep your scheduled follow-up appointments. At these appointments, your health care provider will be checking to make sure that you are healing physically and emotionally. °SEEK MEDICAL CARE IF:  °· You are passing large clots from your vagina. Save any clots to show your health care provider. °· You have a foul smelling discharge from your vagina. °· You have trouble urinating. °· You are urinating frequently. °· You have pain when you urinate. °· You have a change in your bowel movements. °· You have increasing redness, pain, or swelling near your incision. °· You have pus draining from your incision. °· Your incision is separating. °· You have painful, hard, or reddened breasts. °· You have a severe headache. °· You have blurred vision or see spots. °· You feel sad or depressed. °· You have thoughts of hurting yourself or your newborn. °· You have questions about your   care, the care of your newborn, or medications. °· You are dizzy or light-headed. °· You have a rash. °· You have pain, redness, or swelling at the site of the removed intravenous access (IV) tube. °· You have nausea or vomiting. °· You stopped breastfeeding and have not had a menstrual period within 12 weeks of stopping. °· You are not  breastfeeding and have not had a menstrual period within 12 weeks of delivery. °· You have a fever. °SEEK IMMEDIATE MEDICAL CARE IF: °· You have persistent pain. °· You have chest pain. °· You have shortness of breath. °· You faint. °· You have leg pain. °· You have stomach pain. °· Your vaginal bleeding saturates 2 or more sanitary pads in 1 hour. °MAKE SURE YOU:  °· Understand these instructions. °· Will watch your condition. °· Will get help right away if you are not doing well or get worse. °Document Released: 12/22/2001 Document Revised: 08/16/2013 Document Reviewed: 11/27/2011 °ExitCare® Patient Information ©2015 [employer]. This information is not intended to replace advice given to you by your health care provider. Make sure you discuss any questions you have with your health care provider. ° ° °

## 2015-04-27 NOTE — H&P (Addendum)
Obstetrics Admission History & Physical  04/27/2015 - 1800 Primary OBGYN: [facility]  Chief Complaint: malpresentation and oligohydramnios  History of Present Illness  30 y.o. G1 @ 601w1d (Dating: EDC 1/25, LMP=11), with the above CC. Pregnancy complicated by: h/o sinus tachycardia and dizziness, h/o MI asthma, BMI 30, h/o migraines  Patient was seen in office today for regular PNV and was felt to be breech on leopolds. Bedside u/s confirms and also noted AFI of 4. Patient denies any LOF/gush of fluid, labor s/s or decreased FM. Pt has been NPO since clinic.   Review of Systems:  her 12 point review of systems is negative or as noted in the History of Present Illness.  PMHx:  Past Medical History  Diagnosis Date  . Allergy   . Asthma   . Dizziness   . Sinus tachycardia (HCC)   . Migraine    PSHx:  Past Surgical History  Procedure Laterality Date  . Wisdom tooth extraction     Medications: PNV, Pulmicort 180 bid, albuterol PRN, claritin Allergies: has No Known Allergies. OBHx:  OB History  Gravida Para Term Preterm AB SAB TAB Ectopic Multiple Living  1             # Outcome Date GA Lbr Len/2nd Weight Sex Delivery Anes PTL Lv  1 Current               GYNHx:  History of abnormal pap smears: No. History of STIs: No..             FHx:  Family History  Problem Relation Age of Onset  . Cancer Maternal Grandfather    Soc Hx:  Social History   Social History  . Marital Status: Married    Spouse Name: N/A  . Number of Children: 0  . Years of Education: N/A   Occupational History  . Teacher     [employer]   Social History Main Topics  . Smoking status: Never Smoker   . Smokeless tobacco: Not on file  . Alcohol Use: No  . Drug Use: No  . Sexual Activity:    Partners: Male   Other Topics Concern  . Not on file   Social History Narrative    Objective  AF VS normal and stable  EFM: 145 baseline, +accels, rare slight variables, mod var  Toco: q4473m  General: Well  nourished, well developed female in no acute distress.  Skin:  Warm and dry.  Cardiovascular: Regular rate and rhythm. Respiratory:  Clear to auscultation bilateral. Normal respiratory effort Abdomen: gravid, nttp Neuro/Psych:  Normal mood and affect.   Labs  none  Radiology none  Perinatal info  O pos/ Rubella  Not immune / Varicella Not immune/RPR neg/HIV Negative/HepB Surf Ag Negative/TDaP:yes / Flu shot: yes/pap neg 2015/  Assessment & Plan   30 y.o. G1P0 @ 641w1d with possible oligo and malpresentation *IUP:  RNST, category II tracing due to slight variables. Fetal status overall reassuring due to normal baseline, mod var and accels and c/w oligo *Oligo: formal u/s ordered. Pt and husband told that if still low on u/s, since she's term, would recommend proceeding with delivery. *Malpresentation: If needs to proceed with delivery tonight and still with oligo, recommended c-section, given inability to attempt ECV. Will base potential uterine incision on lie of the fetus.  *GBS: neg *MI asthma: on pulmicort bid and reports no need for rescue *[facility] and s/p negative holter and echo in august 2016.  On no meds. *Analgesia: no needs *Dispo: pending u/s  Cornelia Copa. MD [facility]BGYN Pager [phone]   ADDENDUM @ 1916 U/s shows AFI 4 and breech with spine maternal left and fundal placenta. D/w pt and husband recommend proceeding with c-section, which they are amenable to. Last PO of some water at 1500. Will get pre op labs and proceed when fine with anesthesia. Fetus category II due to occasional slight variables but still with accels and mod variability and normal baseline.  Cornelia Copa MD [facility]  Pager: [phone]

## 2015-04-28 ENCOUNTER — Encounter: Payer: Self-pay | Admitting: Obstetrics and Gynecology

## 2015-04-28 LAB — HEMATOCRIT: HEMATOCRIT: 30.7 % — AB (ref 35.0–47.0)

## 2015-04-28 MED ORDER — MENTHOL 3 MG MT LOZG
1.0000 | LOZENGE | OROMUCOSAL | Status: DC | PRN
  Filled 2015-04-28: qty 9

## 2015-04-28 MED ORDER — SENNOSIDES-DOCUSATE SODIUM 8.6-50 MG PO TABS
1.0000 | ORAL_TABLET | Freq: Every evening | ORAL | Status: DC | PRN
Start: 2015-04-28 — End: 2015-04-30

## 2015-04-28 MED ORDER — POLYETHYLENE GLYCOL 3350 17 G PO PACK
17.0000 g | PACK | Freq: Every day | ORAL | Status: DC
  Administered 2015-04-28 – 2015-04-30 (×2): 17 g via ORAL
  Filled 2015-04-28 (×3): qty 1

## 2015-04-28 MED ORDER — VARICELLA VIRUS VACCINE LIVE 1350 PFU/0.5ML IJ SUSR
0.5000 mL | Freq: Once | INTRAMUSCULAR | Status: AC
  Administered 2015-04-30: 0.5 mL via SUBCUTANEOUS
  Filled 2015-04-28 (×4): qty 0.5

## 2015-04-28 MED ORDER — DIBUCAINE 1 % RE OINT: 1.0000 "application " | TOPICAL_OINTMENT | RECTAL | Status: DC | PRN

## 2015-04-28 MED ORDER — IBUPROFEN 600 MG PO TABS
ORAL_TABLET | ORAL | Status: AC
  Administered 2015-04-28: 600 mg via ORAL
  Filled 2015-04-28: qty 1

## 2015-04-28 MED ORDER — FENTANYL CITRATE (PF) 100 MCG/2ML IJ SOLN
INTRAMUSCULAR | Status: AC
  Administered 2015-04-28: 50 ug via INTRAVENOUS
  Filled 2015-04-28: qty 2

## 2015-04-28 MED ORDER — LACTATED RINGERS IV SOLN
INTRAVENOUS | Status: DC
  Administered 2015-04-28: 05:00:00 via INTRAVENOUS

## 2015-04-28 MED ORDER — ALBUTEROL SULFATE (2.5 MG/3ML) 0.083% IN NEBU: 3.0000 mL | INHALATION_SOLUTION | Freq: Four times a day (QID) | RESPIRATORY_TRACT | Status: DC | PRN

## 2015-04-28 MED ORDER — OXYCODONE HCL 5 MG PO TABS
10.0000 mg | ORAL_TABLET | ORAL | Status: DC | PRN
  Administered 2015-04-28 – 2015-04-30 (×9): 10 mg via ORAL
  Filled 2015-04-28 (×9): qty 2

## 2015-04-28 MED ORDER — SIMETHICONE 80 MG PO CHEW
80.0000 mg | CHEWABLE_TABLET | Freq: Two times a day (BID) | ORAL | Status: DC
  Administered 2015-04-28 – 2015-04-30 (×5): 80 mg via ORAL
  Filled 2015-04-28 (×5): qty 1

## 2015-04-28 MED ORDER — ALBUTEROL SULFATE HFA 108 (90 BASE) MCG/ACT IN AERS
2.0000 | INHALATION_SPRAY | Freq: Four times a day (QID) | RESPIRATORY_TRACT | Status: DC | PRN
  Filled 2015-04-28: qty 6.7

## 2015-04-28 MED ORDER — LANOLIN HYDROUS EX OINT: 1.0000 "application " | TOPICAL_OINTMENT | CUTANEOUS | Status: DC | PRN

## 2015-04-28 MED ORDER — PRENATAL MULTIVITAMIN CH
1.0000 | ORAL_TABLET | Freq: Every day | ORAL | Status: DC
Start: 2015-04-28 — End: 2015-04-30
  Administered 2015-04-28 – 2015-04-30 (×3): 1 via ORAL
  Filled 2015-04-28 (×3): qty 1

## 2015-04-28 MED ORDER — OXYTOCIN 10 UNIT/ML IJ SOLN
2.5000 [IU]/h | INTRAVENOUS | Status: AC
  Filled 2015-04-28: qty 4

## 2015-04-28 MED ORDER — IBUPROFEN 600 MG PO TABS
600.0000 mg | ORAL_TABLET | Freq: Four times a day (QID) | ORAL | Status: DC | PRN
Start: 2015-04-28 — End: 2015-04-30
  Administered 2015-04-28 – 2015-04-30 (×10): 600 mg via ORAL
  Filled 2015-04-28 (×10): qty 1

## 2015-04-28 MED ORDER — OXYCODONE HCL 5 MG PO TABS
ORAL_TABLET | ORAL | Status: AC
  Filled 2015-04-28: qty 1

## 2015-04-28 MED ORDER — ACETAMINOPHEN 325 MG PO TABS: 650.0000 mg | ORAL_TABLET | ORAL | Status: DC | PRN

## 2015-04-28 MED ORDER — WITCH HAZEL-GLYCERIN EX PADS: 1.0000 "application " | MEDICATED_PAD | CUTANEOUS | Status: DC | PRN

## 2015-04-28 MED ORDER — LORATADINE 10 MG PO TABS
10.0000 mg | ORAL_TABLET | Freq: Every day | ORAL | Status: DC
  Administered 2015-04-28 – 2015-04-30 (×3): 10 mg via ORAL
  Filled 2015-04-28 (×3): qty 1

## 2015-04-28 MED ORDER — DIPHENHYDRAMINE HCL 25 MG PO CAPS: 25.0000 mg | ORAL_CAPSULE | Freq: Four times a day (QID) | ORAL | Status: DC | PRN

## 2015-04-28 MED ORDER — OXYCODONE HCL 5 MG PO TABS
5.0000 mg | ORAL_TABLET | ORAL | Status: DC | PRN
  Administered 2015-04-28 – 2015-04-30 (×4): 5 mg via ORAL
  Filled 2015-04-28 (×3): qty 1

## 2015-04-28 NOTE — Anesthesia Postprocedure Evaluation (Signed)
Anesthesia Post Note  Patient: Autumn DrownHeather Todd  Procedure(s) Performed: Procedure(s) (LRB): CESAREAN SECTION (N/A)  Patient location during evaluation: Mother Baby Anesthesia Type: Epidural Level of consciousness: awake and alert, oriented and patient cooperative Pain management: satisfactory to patient Vital Signs Assessment: post-procedure vital signs reviewed and stable Respiratory status: respiratory function stable Cardiovascular status: stable Postop Assessment: no headache, no backache, patient able to bend at knees, no signs of nausea or vomiting and adequate PO intake Anesthetic complications: no    Last Vitals:  Filed Vitals:   04/28/15 0453 04/28/15 0554  BP: 107/63 104/48  Pulse: 105 104  Temp: 36.9 C 37 C  Resp: 21 21    Last Pain:  Filed Vitals:   04/28/15 0554  PainSc: 4                  Dniyah Grant,  Rosanne SackKasey A

## 2015-04-28 NOTE — Progress Notes (Signed)
Admit Date: 04/27/2015 Today's Date: 04/28/2015  Subjective: Postpartum Day 1: Cesarean Delivery for Breech Patient reports incisional pain and tolerating PO.    Objective: Vital signs in last 24 hours: Temp:  [98.1 F (36.7 C)-99 F (37.2 C)] 98.3 F (36.8 C) (01/13 0759) Pulse Rate:  [97-218] 101 (01/13 0759) Resp:  [16-21] 21 (01/13 0554) BP: (103-130)/(48-88) 108/49 mmHg (01/13 0759) SpO2:  [97 %-99 %] 99 % (01/13 0759) Weight:  [154 lb (69.854 kg)] 154 lb (69.854 kg) (01/12 1915)  Physical Exam:  General: alert, cooperative and no distress Lochia: appropriate Uterine Fundus: firm Incision: dressing dry/clean DVT Evaluation: No evidence of DVT seen on physical exam.   Recent Labs  04/27/15 1914 04/28/15 0732  HGB 12.4  --   HCT 38.2 30.7*    Assessment/Plan: Status post Cesarean section. Doing well postoperatively.  Continue current care. Breast feeding Plans Minipill Needs Rubella, Varicella Remove foley, ambulate, advance diet  Autumn Todd PAUL 04/28/2015, 9:00 AM

## 2015-04-29 ENCOUNTER — Encounter: Payer: Self-pay | Admitting: Obstetrics and Gynecology

## 2015-04-29 LAB — RPR: RPR: NONREACTIVE

## 2015-04-29 NOTE — Progress Notes (Signed)
Admit Date: 04/27/2015 Today's Date: 04/29/2015  Subjective: Postpartum Day 2: Cesarean Delivery for Breech Patient reports decreased incisional pain, tolerating PO, and voiding without difficulty.    Objective: Vital signs in last 24 hours: Temp:  [97.6 F (36.4 C)-98.6 F (37 C)] 97.6 F (36.4 C) (01/14 0756) Pulse Rate:  [74-94] 94 (01/14 0756) Resp:  [18-20] 20 (01/14 0756) BP: (105-111)/(58-74) 105/64 mmHg (01/14 0756) SpO2:  [98 %-99 %] 99 % (01/14 0756)  Physical Exam:  General: alert, cooperative and no distress Lochia: appropriate Uterine Fundus: firm Incision: no drainage, no s/s of infection, no dehiscence DVT Evaluation: No evidence of DVT seen on physical exam.   Recent Labs  04/27/15 1914 04/28/15 0732  HGB 12.4  --   HCT 38.2 30.7*    Assessment/Plan: Status post Cesarean section. Doing well postoperatively.  Continue current care. Breast feeding Plans Minip[place] Needs Rubella, Varicella   Autumn Todd 04/29/2015, 12:44 PM

## 2015-04-29 NOTE — Progress Notes (Signed)
Patient discouraged about amount of milk she is producing to feed baby at this time. Patient was educated on when breast milk usually comes in and cluster feedings. She asked about different options for feeding and satisfying baby. Patient already uses a nipple shield and a breast pump. SNS and using a curved tip syringe was offered and explained. Patient verbalizes understanding but states that she still wants to try formula in a bottle since baby will "use a bottle when [patient] goes back to school." I explained that it may be harder to return to breast after given a bottle. Patient verbalizes understanding but still opted for formula in a bottle. Patient will continue to pump every 3 hours and give baby what is pumped along with formula in a bottle.

## 2015-04-30 LAB — CBC
HCT: 29.4 % — ABNORMAL LOW (ref 35.0–47.0)
HEMOGLOBIN: 9.7 g/dL — AB (ref 12.0–16.0)
MCH: 29.3 pg (ref 26.0–34.0)
MCHC: 33.1 g/dL (ref 32.0–36.0)
MCV: 88.5 fL (ref 80.0–100.0)
Platelets: 238 10*3/uL (ref 150–440)
RBC: 3.32 MIL/uL — ABNORMAL LOW (ref 3.80–5.20)
RDW: 14 % (ref 11.5–14.5)
WBC: 9.2 10*3/uL (ref 3.6–11.0)

## 2015-04-30 MED ORDER — IBUPROFEN 600 MG PO TABS: 600.0000 mg | ORAL_TABLET | Freq: Four times a day (QID) | ORAL | Status: DC | PRN

## 2015-04-30 MED ORDER — NORETHINDRONE ACETATE 5 MG PO TABS: 2.5000 mg | ORAL_TABLET | Freq: Every day | ORAL | Status: DC

## 2015-04-30 MED ORDER — NORETHINDRONE ACETATE 5 MG PO TABS: 5.0000 mg | ORAL_TABLET | Freq: Every day | ORAL | Status: DC

## 2015-04-30 MED ORDER — OXYCODONE HCL 5 MG PO TABS: 5.0000 mg | ORAL_TABLET | ORAL | Status: DC | PRN

## 2015-04-30 MED ORDER — VARICELLA VIRUS VACCINE LIVE 1350 PFU/0.5ML IJ SUSR: 0.5000 mL | Freq: Once | INTRAMUSCULAR | Status: DC

## 2015-04-30 MED ORDER — MEASLES, MUMPS & RUBELLA VAC ~~LOC~~ INJ: 0.5000 mL | INJECTION | Freq: Once | SUBCUTANEOUS | Status: DC

## 2015-05-01 LAB — SURGICAL PATHOLOGY

## 2015-06-06 ENCOUNTER — Encounter: Payer: Self-pay | Admitting: Family Medicine

## 2015-06-06 ENCOUNTER — Ambulatory Visit (INDEPENDENT_AMBULATORY_CARE_PROVIDER_SITE_OTHER): Payer: BC Managed Care – PPO | Admitting: Family Medicine

## 2015-06-06 VITALS — BP 118/72 | HR 119 | Temp 98.7°F | Resp 14 | Ht 60.0 in | Wt 132.2 lb

## 2015-06-06 DIAGNOSIS — M654 Radial styloid tenosynovitis [de Quervain]: Secondary | ICD-10-CM | POA: Insufficient documentation

## 2015-06-06 DIAGNOSIS — L309 Dermatitis, unspecified: Secondary | ICD-10-CM | POA: Diagnosis not present

## 2015-06-06 HISTORY — DX: Dermatitis, unspecified: L30.9

## 2015-06-06 HISTORY — DX: Radial styloid tenosynovitis (de quervain): M65.4

## 2015-06-06 NOTE — Progress Notes (Signed)
Name: Autumn Todd   MRN: [id number]    DOB: 11-12-1985   Date:06/06/2015       Progress Note  Subjective  Chief Complaint  Chief Complaint  Patient presents with  . Wrist Pain    Left, onset 2 weeks. Has newborn and has been holding her maybe due to that.    HPI  Autumn Todd is a 30 year old female with known history of Asthma who is here today post partum about 1 month now with complaints of left wrist pain and right hand dryness. Her baby is 25 month old, female, had a C-section due to low amniotic fluid and breech presentation. Baby is doing well. Since having baby she has had left wrist pain at the thumb area dorsal surface with no swelling or numbness. Her right hand is dry dorsal area. On questions she does note washing hands more since having baby.   Past Medical History  Diagnosis Date  . Allergy   . Asthma   . Dizziness   . Sinus tachycardia (HCC)   . Migraine     Patient Active Problem List   Diagnosis Date Noted  . De Quervain's tenosynovitis, left 06/06/2015  . Eczema of right hand 06/06/2015  . Cesarean delivery delivered 04/28/2015  . Malpresentation before onset of labor 04/27/2015  . Asthma, mild persistent 10/11/2014    Social History  Substance Use Topics  . Smoking status: Never Smoker   . Smokeless tobacco: Not on file  . Alcohol Use: No     Current outpatient prescriptions:  .  DULERA 200-5 MCG/ACT AERO, TK 2 PUFFS BID, Disp: , Rfl: 3 .  PRENATAL 28-0.8 MG TABS, Take 1 tablet by mouth daily., Disp: , Rfl:   Past Surgical History  Procedure Laterality Date  . Wisdom tooth extraction    . Cesarean section N/A 04/27/2015    Procedure: CESAREAN SECTION;  Surgeon: Picacho Bing, MD;  Location: [facility] ORS;  Service: Obstetrics;  Laterality: N/A;    Family History  Problem Relation Age of Onset  . Cancer Maternal Grandfather     No Known Allergies   Review of Systems  CONSTITUTIONAL: No significant weight changes, fever, chills,  weakness or fatigue.  SKIN: Yes rash dryness to hand. CARDIOVASCULAR: No chest pain, chest pressure or chest discomfort. No palpitations or edema.  RESPIRATORY: No shortness of breath, cough or sputum.  NEUROLOGICAL: No headache, dizziness, syncope, paralysis, ataxia, numbness or tingling in the extremities. No memory changes. No change in bowel or bladder control.  MUSCULOSKELETAL: Yes joint pain. No muscle pain. HEMATOLOGIC: No anemia, bleeding or bruising.  LYMPHATICS: No enlarged lymph nodes.  PSYCHIATRIC: No change in mood. No change in sleep pattern.  ENDOCRINOLOGIC: No reports of sweating, cold or heat intolerance. No polyuria or polydipsia.     Objective  BP 118/72 mmHg  Pulse 119  Temp(Src) 98.7 F (37.1 C) (Oral)  Resp 14  Ht 5' (1.524 m)  Wt 132 lb 3.2 oz (59.966 kg)  BMI 25.82 kg/m2  SpO2 97%  LMP 08/13/2014 (Approximate)  Breastfeeding? Yes Body mass index is 25.82 kg/(m^2).  Physical Exam  Constitutional: Patient appears well-developed, petite and well-nourished. In no distress.  Cardiovascular: Normal rate, regular rhythm and normal heart sounds.  No murmur heard.  Pulmonary/Chest: Effort normal and breath sounds normal. No respiratory distress. Musculoskeletal: Normal range of motion bilateral UE and LE, no joint effusions. Left wrist positive Finkelstein's testing. Right hand rough skin over knuckles and dorsum of hand without breaks  in skin. Peripheral vascular: Bilateral LE no edema. Neurological: CN II-XII grossly intact with no focal deficits. Alert and oriented to person, place, and time. Coordination, balance, strength, speech and gait are normal.  Skin: Skin is warm and dry. No rash noted. No erythema.  Psychiatric: Patient has a normal mood and affect. Behavior is normal in office today. Judgment and thought content normal in office today.     Assessment & Plan   1. De Quervain's tenosynovitis, left Discussed pathology in relation to her being a  first time mother. Document provided on home exercises, gentle ace bandage wrapping.  2. Eczema of right hand Likely due to increased freq of washing hands. Use more emollient cream or oil.

## 2015-07-24 ENCOUNTER — Ambulatory Visit (INDEPENDENT_AMBULATORY_CARE_PROVIDER_SITE_OTHER): Payer: BC Managed Care – PPO | Admitting: Family Medicine

## 2015-07-24 ENCOUNTER — Encounter: Payer: Self-pay | Admitting: Family Medicine

## 2015-07-24 VITALS — BP 112/62 | HR 105 | Temp 98.3°F | Resp 18 | Ht 60.0 in | Wt 132.6 lb

## 2015-07-24 DIAGNOSIS — R000 Tachycardia, unspecified: Secondary | ICD-10-CM

## 2015-07-24 DIAGNOSIS — H538 Other visual disturbances: Secondary | ICD-10-CM | POA: Diagnosis not present

## 2015-07-24 DIAGNOSIS — D649 Anemia, unspecified: Secondary | ICD-10-CM | POA: Diagnosis not present

## 2015-07-24 HISTORY — DX: Anemia, unspecified: D64.9

## 2015-07-24 NOTE — Patient Instructions (Addendum)
Let's get labs today If you have not heard anything from my staff in a week about any orders/referrals/studies from today, please contact us here to follow-up [phone] Do schedule a visit with your eye doctor and ask him/her to send me a copy of their note and we'll consider neurology if they don't think these are vision related or occular migraines If your symptoms progress, please call me

## 2015-07-24 NOTE — Progress Notes (Signed)
BP 112/62 mmHg  Pulse 105  Temp(Src) 98.3 F (36.8 C) (Oral)  Resp 18  Ht 5' (1.524 m)  Wt 132 lb 9.6 oz (60.147 kg)  BMI 25.90 kg/m2  SpO2 97%  LMP 06/22/2015  Breastfeeding? No   Subjective:    Patient ID: Autumn Todd, female    DOB: 1985-12-26, 30 y.o.   MRN: [id number]  HPI: Autumn DrownHeather Shippey is a 30 y.o. female  Chief Complaint  Patient presents with  . Blurred Vision    Onset-couple of years, episodes are about 30 minutes long but they are very spread out and random. But states 3 weeks ago she had 3 mini episodes in the same week, which is abnormal for her and wanted to get checked out. She had her daughter 3 months ago and states when pregnant only had 2 episodes the whole time she was pregnant. States her vision will get blurry and it is just her peripheral vision.    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 Allergy   . Asthma   . Sinus tachycardia (HCC)   . Anemia 07/24/2015   Past Surgical History  Procedure Laterality Date  . Wisdom tooth extraction    . Cesarean section N/A  04/27/2015    Procedure: CESAREAN SECTION;  Surgeon: [place] Bingharlie Pickens, MD;  Location: [facility] ORS;  Service: Obstetrics;  Laterality: N/A;   Family History  Problem Relation Age of Onset  . Cancer Maternal Grandfather   no migraines in the family  Social History  Substance Use Topics  . Smoking status: Never Smoker   . Smokeless tobacco: Never Used  . Alcohol Use: No  not much caffeine at all, just when going out to eat  Interim medical history since last visit reviewed. Allergies and medications reviewed and updated.  Review of Systems  Per HPI unless specifically indicated above     Objective:    BP 112/62 mmHg  Pulse 105  Temp(Src) 98.3 F (36.8 C) (Oral)  Resp 18  Ht 5' (1.524 m)  Wt 132 lb 9.6 oz (60.147 kg)  BMI 25.90 kg/m2  SpO2 97%  LMP 06/22/2015  Breastfeeding? No  Wt Readings from Last 3 Encounters:  07/24/15 132 lb 9.6 oz (60.147 kg)  06/06/15 132 lb 3.2 oz (59.966 kg)  04/27/15 154 lb (69.854 kg)    Today's Vitals   07/24/15 1035 07/24/15 1111  BP: 112/62   Pulse: 114 105  Temp: 98.3 F (36.8 C)   TempSrc: Oral   Resp: 18   Height: 5' (1.524 m)   Weight: 132 lb 9.6 oz (  60.147 kg)   SpO2: 97%   recheck heart rate just under 100  Physical Exam  Constitutional: She appears well-developed and well-nourished.  HENT:  Mouth/Throat: Mucous membranes are normal.  Eyes: EOM are normal. No scleral icterus.  Cardiovascular: Normal rate and regular rhythm.   Pulmonary/Chest: Effort normal and breath sounds normal.  Psychiatric: She has a normal mood and affect. Her behavior is normal.      Assessment & Plan:   Problem List Items Addressed This Visit      Other   Anemia    Check CBC      Relevant Orders   CBC with Differential/Platelet (Completed)   Tachycardia    Borderline tachycardia; check CBC and TSH      Relevant Orders   CBC with Differential/Platelet (Completed)   TSH (Completed)   Blurred vision - Primary    Check glucose; patient  to see eye doctor; if glucose normal and if eye examination normal, consider referral to neurologist      Relevant Orders   UA/M w/rflx Culture, Routine (Completed)   Basic metabolic panel (Completed)      Follow up plan: Return if symptoms worsen or fail to improve.  Orders Placed This Encounter  Procedures  . Microscopic Examination  . CBC with Differential/Platelet  . TSH  . UA/M w/rflx Culture, Routine  . Basic metabolic panel   An after-visit summary was printed and given to the patient at check-out.  Please see the patient instructions which may contain other information and recommendations beyond what is mentioned above in the assessment and plan.

## 2015-07-25 LAB — UA/M W/RFLX CULTURE, ROUTINE
Bilirubin, UA: NEGATIVE
Glucose, UA: NEGATIVE
Ketones, UA: NEGATIVE
LEUKOCYTES UA: NEGATIVE
Nitrite, UA: NEGATIVE
PH UA: 6 (ref 5.0–7.5)
PROTEIN UA: NEGATIVE
RBC, UA: NEGATIVE
SPEC GRAV UA: 1.018 (ref 1.005–1.030)
UUROB: 0.2 mg/dL (ref 0.2–1.0)

## 2015-07-25 LAB — MICROSCOPIC EXAMINATION: CASTS: NONE SEEN /LPF

## 2015-07-25 LAB — CBC WITH DIFFERENTIAL/PLATELET
BASOS ABS: 0.1 10*3/uL (ref 0.0–0.2)
Basos: 1 %
EOS (ABSOLUTE): 0.5 10*3/uL — AB (ref 0.0–0.4)
Eos: 6 %
Hematocrit: 41.7 % (ref 34.0–46.6)
Hemoglobin: 13.7 g/dL (ref 11.1–15.9)
IMMATURE GRANS (ABS): 0 10*3/uL (ref 0.0–0.1)
Immature Granulocytes: 0 %
LYMPHS: 29 %
Lymphocytes Absolute: 2.4 10*3/uL (ref 0.7–3.1)
MCH: 29.5 pg (ref 26.6–33.0)
MCHC: 32.9 g/dL (ref 31.5–35.7)
MCV: 90 fL (ref 79–97)
MONOCYTES: 7 %
Monocytes Absolute: 0.6 10*3/uL (ref 0.1–0.9)
NEUTROS ABS: 4.7 10*3/uL (ref 1.4–7.0)
Neutrophils: 57 %
PLATELETS: 384 10*3/uL — AB (ref 150–379)
RBC: 4.65 x10E6/uL (ref 3.77–5.28)
RDW: 13.7 % (ref 12.3–15.4)
WBC: 8.3 10*3/uL (ref 3.4–10.8)

## 2015-07-25 LAB — BASIC METABOLIC PANEL
BUN / CREAT RATIO: 13 (ref 9–23)
BUN: 9 mg/dL (ref 6–20)
CALCIUM: 9.9 mg/dL (ref 8.7–10.2)
CO2: 22 mmol/L (ref 18–29)
Chloride: 101 mmol/L (ref 96–106)
Creatinine, Ser: 0.69 mg/dL (ref 0.57–1.00)
GFR, EST AFRICAN AMERICAN: 138 mL/min/{1.73_m2} (ref >59)
GFR, EST NON AFRICAN AMERICAN: 120 mL/min/{1.73_m2} (ref >59)
Glucose: 92 mg/dL (ref 65–99)
Potassium: 5 mmol/L (ref 3.5–5.2)
Sodium: 143 mmol/L (ref 134–144)

## 2015-07-25 LAB — TSH: TSH: 1.82 u[IU]/mL (ref 0.450–4.500)

## 2015-08-13 NOTE — Assessment & Plan Note (Signed)
Check CBC 

## 2015-08-13 NOTE — Assessment & Plan Note (Signed)
Borderline tachycardia; check CBC and TSH

## 2015-08-13 NOTE — Assessment & Plan Note (Signed)
Check glucose; patient to see eye doctor; if glucose normal and if eye examination normal, consider referral to neurologist

## 2015-12-20 ENCOUNTER — Other Ambulatory Visit: Payer: Self-pay

## 2015-12-20 MED ORDER — DULERA 200-5 MCG/ACT IN AERO: 2.0000 | INHALATION_SPRAY | Freq: Two times a day (BID) | RESPIRATORY_TRACT | 2 refills | Status: DC

## 2015-12-20 NOTE — Telephone Encounter (Signed)
rx sent

## 2016-07-25 ENCOUNTER — Other Ambulatory Visit: Payer: Self-pay | Admitting: Obstetrics and Gynecology

## 2016-07-25 MED ORDER — LO LOESTRIN FE 1 MG-10 MCG / 10 MCG PO TABS: ORAL_TABLET | ORAL | 0 refills | Status: DC

## 2016-08-15 ENCOUNTER — Telehealth: Payer: Self-pay | Admitting: Family Medicine

## 2016-08-15 MED ORDER — HEPATITIS A VACCINE 1440 EL U/ML IM SUSP: 1.0000 mL | Freq: Once | INTRAMUSCULAR | 0 refills | Status: AC

## 2016-08-15 NOTE — Telephone Encounter (Signed)
Husband called requesting hep A vaccines for himself and wife; they are traveling to [place] in July; explained two booster series, may not afford protection so soon, contact health dept with questions; no known previous reaction to hepatitis vaccine; will send Rx; agree small amount of protection likely better than no protection, but didn't want them to feel complacent and protected; he will contact health dept; rx to [facility]

## 2016-10-07 ENCOUNTER — Ambulatory Visit: Payer: Self-pay | Admitting: Obstetrics and Gynecology

## 2016-10-17 ENCOUNTER — Other Ambulatory Visit: Payer: Self-pay | Admitting: Obstetrics and Gynecology

## 2016-10-31 ENCOUNTER — Ambulatory Visit (INDEPENDENT_AMBULATORY_CARE_PROVIDER_SITE_OTHER): Payer: BC Managed Care – PPO | Admitting: Advanced Practice Midwife

## 2016-10-31 ENCOUNTER — Encounter: Payer: Self-pay | Admitting: Advanced Practice Midwife

## 2016-10-31 VITALS — BP 118/76 | Ht 60.0 in | Wt 141.0 lb

## 2016-10-31 DIAGNOSIS — Z3041 Encounter for surveillance of contraceptive pills: Secondary | ICD-10-CM | POA: Diagnosis not present

## 2016-10-31 DIAGNOSIS — Z124 Encounter for screening for malignant neoplasm of cervix: Secondary | ICD-10-CM | POA: Diagnosis not present

## 2016-10-31 DIAGNOSIS — Z01419 Encounter for gynecological examination (general) (routine) without abnormal findings: Secondary | ICD-10-CM | POA: Diagnosis not present

## 2016-10-31 MED ORDER — LO LOESTRIN FE 1 MG-10 MCG / 10 MCG PO TABS: 1.0000 | ORAL_TABLET | Freq: Every day | ORAL | 3 refills | Status: DC

## 2016-10-31 NOTE — Progress Notes (Signed)
Patient ID: Autumn Todd, female   DOB: Aug 29, 1985, 31 y.o.   MRN: [id number]     Gynecology Annual Exam  PCP: Kerman PasseyLada, Melinda P, MD  Chief Complaint:  Chief Complaint  Patient presents with  . Annual Exam    History of Present Illness: Patient is a 31 y.o. G1P1001 presents for annual exam. The patient has no complaints today.   LMP: Patient's last menstrual period was 10/26/2016. Average Interval: irregular, approximately every 3 months  Duration of flow: 2 days Heavy Menses: no Clots: no Intermenstrual Bleeding: no Postcoital Bleeding: no Dysmenorrhea: no  The patient is sexually active. She currently uses OCP (estrogen/progesterone) for contraception. She denies dyspareunia.  The patient does not perform self breast exams.  There is no notable family history of breast or ovarian cancer in her family.  The patient wears seatbelts: yes.   The patient has regular exercise: yes.    The patient denies current symptoms of depression.    Review of Systems: Review of Systems  Constitutional: Negative.   HENT: Negative.   Eyes: Negative.   Respiratory: Negative.   Cardiovascular: Negative.   Gastrointestinal: Negative.   Genitourinary: Negative.   Musculoskeletal: Negative.   Skin: Negative.   Neurological: Negative.   Endo/Heme/Allergies: Negative.   Psychiatric/Behavioral: Negative.     Past Medical History:  Past Medical History:  Diagnosis Date  . Allergy   . Anemia 07/24/2015  . Asthma   . Sinus tachycardia     Past Surgical History:  Past Surgical History:  Procedure Laterality Date  . CESAREAN SECTION N/A 04/27/2015   Procedure: CESAREAN SECTION;  Surgeon: [place] Bingharlie Pickens, MD;  Location: [facility] ORS;  Service: Obstetrics;  Laterality: N/A;  . WISDOM TOOTH EXTRACTION      Gynecologic History:  Patient's last menstrual period was 10/26/2016. Contraception: OCP (estrogen/progesterone) Last Pap: Results were: no abnormalities   Obstetric History:  G1P1001  Family History:  Family History  Problem Relation Age of Onset  . Cancer Maternal Grandfather     Social History:  Social History   Social History  . Marital status: Married    Spouse name: N/A  . Number of children: 0  . Years of education: N/A   Occupational History  . Teacher     [employer]   Social History Main Topics  . Smoking status: Never Smoker  . Smokeless tobacco: Never Used  . Alcohol use No  . Drug use: No  . Sexual activity: Yes    Partners: Male    Birth control/ protection: Pill   Other Topics Concern  . Not on file   Social History Narrative  . No narrative on file    Allergies:  No Known Allergies  Medications: Prior to Admission medications   Medication Sig Start Date End Date Taking? Authorizing Provider  DULERA 200-5 MCG/ACT AERO Inhale 2 puffs into the lungs 2 (two) times daily. 12/20/15  Yes Lada, Janit BernMelinda P, MD  LO LOESTRIN FE 1 MG-10 MCG / 10 MCG tablet TAKE 1 TABLET BY MOUTH DAILY 10/17/16  Yes Copland, Alicia B, PA-C  loratadine (CLARITIN) 10 MG tablet Take 10 mg by mouth daily.   Yes [provider]    Physical Exam Vitals: Blood pressure 118/76, height 5' (1.524 m), weight 141 lb (64 kg), last menstrual period 10/26/2016, not currently breastfeeding.  General: NAD HEENT: normocephalic, anicteric Thyroid: no enlargement, no palpable nodules Pulmonary: No increased work of breathing, CTAB Cardiovascular: RRR, distal pulses 2+ Breast: Breast symmetrical, no tenderness,  no palpable nodules or masses, no skin or nipple retraction present, no nipple discharge.  No axillary or supraclavicular lymphadenopathy. Abdomen: NABS, soft, non-tender, non-distended.  Umbilicus without lesions.  No hepatomegaly, splenomegaly or masses palpable. No evidence of hernia  Genitourinary:  External: Normal external female genitalia.  Normal urethral meatus, normal  Bartholin's and Skene's glands.    Vagina: Normal vaginal mucosa, no evidence of  prolapse.    Cervix: Grossly normal in appearance, no bleeding, no CMT  Uterus: Non-enlarged, mobile, normal contour.    Adnexa: ovaries non-enlarged, no adnexal masses  Rectal: deferred  Lymphatic: no evidence of inguinal lymphadenopathy Extremities: no edema, erythema, or tenderness Neurologic: Grossly intact Psychiatric: mood appropriate, affect full   Assessment: 31 y.o. G1P1001 Well woman exam with PAP smear  Plan: Problem List Items Addressed This Visit    None    Visit Diagnoses    Well woman exam with routine gynecological exam    -  Primary   Cervical cancer screening          1) STI screening was offered and declined  2) ASCCP guidelines and rational discussed.  Patient opts for yearly screening interval  3) Contraception - patient prefers to continue with current OCP  4) Routine healthcare maintenance including cholesterol, diabetes screening discussed Declines   5) Continue healthy lifestyle diet and exercise  6) Follow up 1 year for routine annual exam   Tresea Mall, CNM

## 2016-11-02 LAB — IGP, RFX APTIMA HPV ASCU: PAP SMEAR COMMENT: 0

## 2017-01-11 ENCOUNTER — Other Ambulatory Visit: Payer: Self-pay | Admitting: Obstetrics and Gynecology

## 2017-02-07 ENCOUNTER — Encounter: Payer: Self-pay | Admitting: Family Medicine

## 2017-02-07 ENCOUNTER — Ambulatory Visit (INDEPENDENT_AMBULATORY_CARE_PROVIDER_SITE_OTHER): Payer: BC Managed Care – PPO | Admitting: Family Medicine

## 2017-02-07 VITALS — BP 118/72 | HR 96 | Temp 98.0°F | Resp 14 | Wt 135.8 lb

## 2017-02-07 DIAGNOSIS — J309 Allergic rhinitis, unspecified: Secondary | ICD-10-CM

## 2017-02-07 DIAGNOSIS — J343 Hypertrophy of nasal turbinates: Secondary | ICD-10-CM

## 2017-02-07 DIAGNOSIS — Z23 Encounter for immunization: Secondary | ICD-10-CM

## 2017-02-07 MED ORDER — PREDNISONE 20 MG PO TABS: 40.0000 mg | ORAL_TABLET | Freq: Every day | ORAL | 0 refills | Status: AC

## 2017-02-07 MED ORDER — LEVOCETIRIZINE DIHYDROCHLORIDE 5 MG PO TABS: 5.0000 mg | ORAL_TABLET | Freq: Every evening | ORAL | 11 refills | Status: DC

## 2017-02-07 NOTE — Patient Instructions (Addendum)
Start the xyzal in place of the claritin (loratidine) Use the prednisone for just five days, take with food  Allergic Rhinitis Allergic rhinitis is when the mucous membranes in the nose respond to allergens. Allergens are particles in the air that cause your body to have an allergic reaction. This causes you to release allergic antibodies. Through a chain of events, these eventually cause you to release histamine into the blood stream. Although meant to protect the body, it is this release of histamine that causes your discomfort, such as frequent sneezing, congestion, and an itchy, runny nose. What are the causes? Seasonal allergic rhinitis (hay fever) is caused by pollen allergens that may come from grasses, trees, and weeds. Year-round allergic rhinitis (perennial allergic rhinitis) is caused by allergens such as house dust mites, pet dander, and mold spores. What are the signs or symptoms?  Nasal stuffiness (congestion).  Itchy, runny nose with sneezing and tearing of the eyes. How is this diagnosed? Your health care provider can help you determine the allergen or allergens that trigger your symptoms. If you and your health care provider are unable to determine the allergen, skin or blood testing may be used. Your health care provider will diagnose your condition after taking your health history and performing a physical exam. Your health care provider may assess you for other related conditions, such as asthma, pink eye, or an ear infection. How is this treated? Allergic rhinitis does not have a cure, but it can be controlled by:  Medicines that block allergy symptoms. These may include allergy shots, nasal sprays, and oral antihistamines.  Avoiding the allergen.  Hay fever may often be treated with antihistamines in pill or nasal spray forms. Antihistamines block the effects of histamine. There are over-the-counter medicines that may help with nasal congestion and swelling around the eyes.  Check with your health care provider before taking or giving this medicine. If avoiding the allergen or the medicine prescribed do not work, there are many new medicines your health care provider can prescribe. Stronger medicine may be used if initial measures are ineffective. Desensitizing injections can be used if medicine and avoidance does not work. Desensitization is when a patient is given ongoing shots until the body becomes less sensitive to the allergen. Make sure you follow up with your health care provider if problems continue. Follow these instructions at home: It is not possible to completely avoid allergens, but you can reduce your symptoms by taking steps to limit your exposure to them. It helps to know exactly what you are allergic to so that you can avoid your specific triggers. Contact a health care provider if:  You have a fever.  You develop a cough that does not stop easily (persistent).  You have shortness of breath.  You start wheezing.  Symptoms interfere with normal daily activities. This information is not intended to replace advice given to you by your health care provider. Make sure you discuss any questions you have with your health care provider. Document Released: 12/25/2000 Document Revised: 12/01/2015 Document Reviewed: 12/07/2012 Elsevier Interactive Patient Education  2017 [employer]

## 2017-02-07 NOTE — Progress Notes (Signed)
BP 118/72   Pulse 96   Temp 98 F (36.7 C) (Oral)   Resp 14   Wt 135 lb 12.8 oz (61.6 kg)   LMP 12/03/2016   SpO2 96%   BMI 26.52 kg/m    Subjective:    Patient ID: Autumn Todd, female    DOB: 02-23-1986, 31 y.o.   MRN: [id number]  HPI: Autumn Todd is a 31 y.o. female  Chief Complaint  Patient presents with  . Nasal Congestion    HPI Here for an acute visit; not sure if sick or allergies Blowing her nose is a typical symptom for her sinus infections Not super frequent Nasal congestion Blowing it out, yellow in the morning and clear through the day Tried her husband's allergy medicine last night, xyzal, not enough time to check it out Has used nasal spray, but did not do much Just started having allergy problems 3-4 years ago Moved from [place] Nothing major in pets or changes with environment No pressure in cheeks or teeth, no ear problems No rash or travel  Depression screen [facility] 2/9 02/07/2017 06/06/2015 02/17/2015 10/11/2014  Decreased Interest 0 0 0 0  Down, Depressed, Hopeless 0 0 0 0  PHQ - 2 Score 0 0 0 0    Relevant past medical, surgical, family and social history reviewed Past Medical History:  Diagnosis Date  . Allergy   . Anemia 07/24/2015  . Asthma   . Sinus tachycardia    Past Surgical History:  Procedure Laterality Date  . CESAREAN SECTION N/A 04/27/2015   Procedure: CESAREAN SECTION;  Surgeon: Hawthorne Bing, MD;  Location: [facility] ORS;  Service: Obstetrics;  Laterality: N/A;  . WISDOM TOOTH EXTRACTION     Family History  Problem Relation Age of Onset  . Cancer Maternal Grandfather    Social History   Social History  . Marital status: Married    Spouse name: N/A  . Number of children: 0  . Years of education: N/A   Occupational History  . Teacher     [employer]   Social History Main Topics  . Smoking status: Never Smoker  . Smokeless tobacco: Never Used  . Alcohol use No  . Drug use: No  . Sexual activity: Yes   Partners: Male    Birth control/ protection: Pill   Other Topics Concern  . Not on file   Social History Narrative  . No narrative on file    Interim medical history since last visit reviewed. Allergies and medications reviewed  Review of Systems Per HPI unless specifically indicated above     Objective:    BP 118/72   Pulse 96   Temp 98 F (36.7 C) (Oral)   Resp 14   Wt 135 lb 12.8 oz (61.6 kg)   LMP 12/03/2016   SpO2 96%   BMI 26.52 kg/m   Wt Readings from Last 3 Encounters:  02/07/17 135 lb 12.8 oz (61.6 kg)  10/31/16 141 lb (64 kg)  07/24/15 132 lb 9.6 oz (60.1 kg)    Physical Exam  Constitutional: She appears well-developed and well-nourished.  HENT:  Right Ear: Tympanic membrane and ear canal normal.  Left Ear: Tympanic membrane and ear canal normal.  Nose: Rhinorrhea present.  Mouth/Throat: Oropharynx is clear and moist and mucous membranes are normal.  Eyes: EOM are normal. No scleral icterus.  Cardiovascular: Normal rate and regular rhythm.   Pulmonary/Chest: Effort normal and breath sounds normal.  Psychiatric: She has a normal mood and affect.  Her behavior is normal.    Results for orders placed or performed in visit on 10/31/16  IGP, rfx Aptima HPV ASCU  Result Value Ref Range   DIAGNOSIS: Comment    Specimen adequacy: Comment    Clinician Provided ICD10 Comment    Performed by: Comment    PAP Smear Comment .    Note: Comment    Test Methodology Comment    PAP Reflex Comment       Assessment & Plan:   Problem List Items Addressed This Visit    None    Visit Diagnoses    Nasal turbinate hypertrophy    -  Primary   xyzal, prednisone short course; patient does not want to use nasal corticosteroid   Needs flu shot       Relevant Orders   Flu Vaccine QUAD 6+ mos PF IM (Fluarix Quad PF) (Completed)   Allergic rhinitis, unspecified seasonality, unspecified trigger       xyzal Rx provided       Follow up plan: No Follow-up on  file.  An after-visit summary was printed and given to the patient at check-out.  Please see the patient instructions which may contain other information and recommendations beyond what is mentioned above in the assessment and plan.  Meds ordered this encounter  Medications  . predniSONE (DELTASONE) 20 MG tablet    Sig: Take 2 tablets (40 mg total) by mouth daily with breakfast.    Dispense:  10 tablet    Refill:  0  . levocetirizine (XYZAL) 5 MG tablet    Sig: Take 1 tablet (5 mg total) by mouth every evening.    Dispense:  30 tablet    Refill:  11    Orders Placed This Encounter  Procedures  . Flu Vaccine QUAD 6+ mos PF IM (Fluarix Quad PF)

## 2017-02-13 ENCOUNTER — Encounter: Payer: Self-pay | Admitting: Family Medicine

## 2017-02-13 MED ORDER — MONTELUKAST SODIUM 10 MG PO TABS: 10.0000 mg | ORAL_TABLET | Freq: Every day | ORAL | 11 refills | Status: DC

## 2017-05-26 ENCOUNTER — Encounter: Payer: Self-pay | Admitting: Family Medicine

## 2017-05-27 ENCOUNTER — Encounter: Payer: Self-pay | Admitting: Family Medicine

## 2017-06-22 ENCOUNTER — Encounter: Payer: Self-pay | Admitting: Family Medicine

## 2017-06-25 MED ORDER — SCOPOLAMINE 1 MG/3DAYS TD PT72: 1.0000 | MEDICATED_PATCH | TRANSDERMAL | 0 refills | Status: DC

## 2017-06-25 NOTE — Addendum Note (Signed)
Addended by: LADA, Janit BernMELINDA P on: 06/25/2017 08:08 AM   Modules accepted: Orders

## 2017-07-29 IMAGING — US US OB LIMITED
1 series · 14 of 26 positions shown · non-contrast
Comparison: none

CLINICAL DATA: Malpresentation.  Assess amniotic fluid index.

EXAM:
LIMITED OBSTETRIC ULTRASOUND

[Series 1: us ob limited · 0.23mm/px · 14 of 26 slices shown]
[im 1/26]
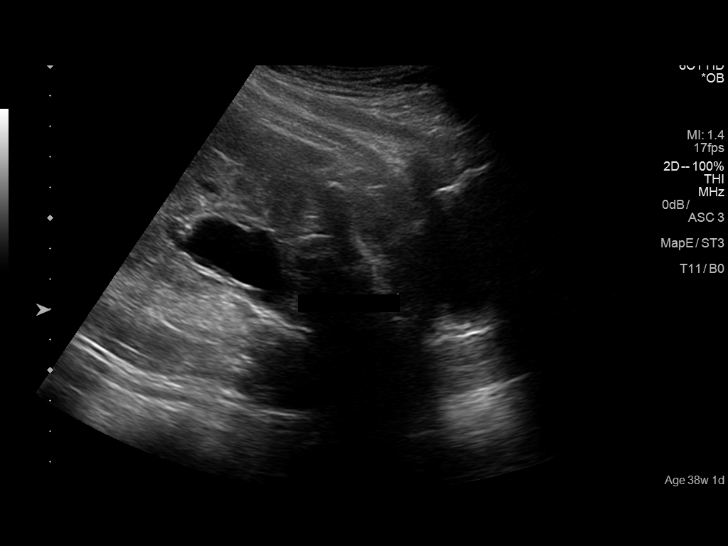
[im 3/26]
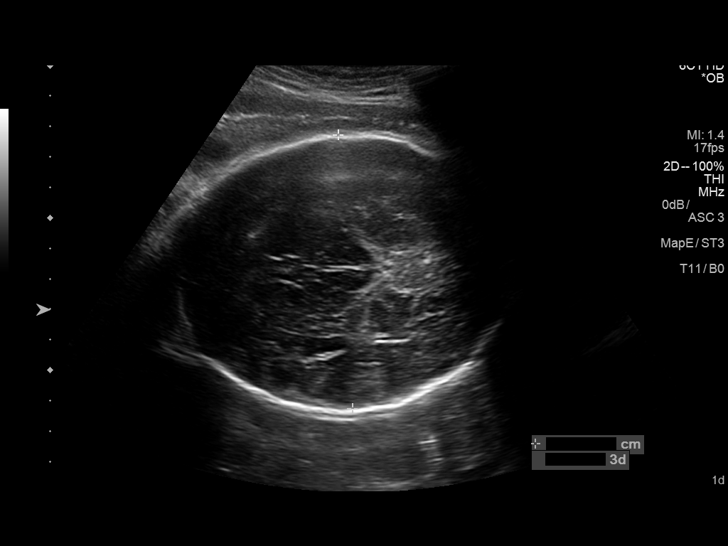
[im 5/26]
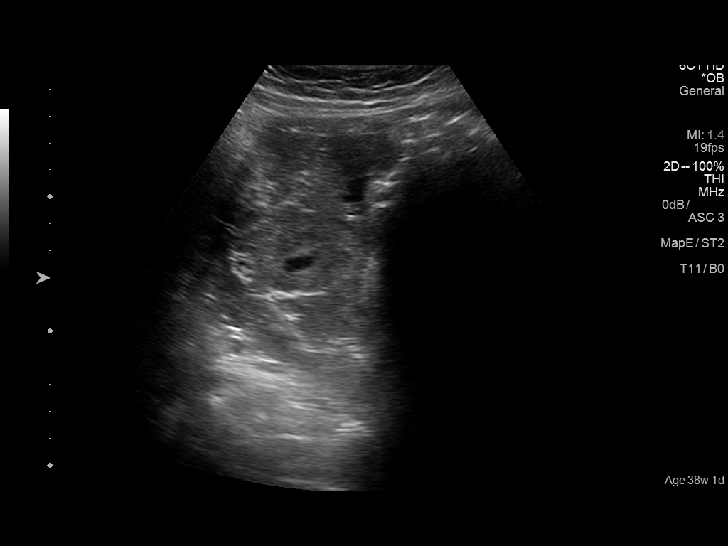
[im 7/26]
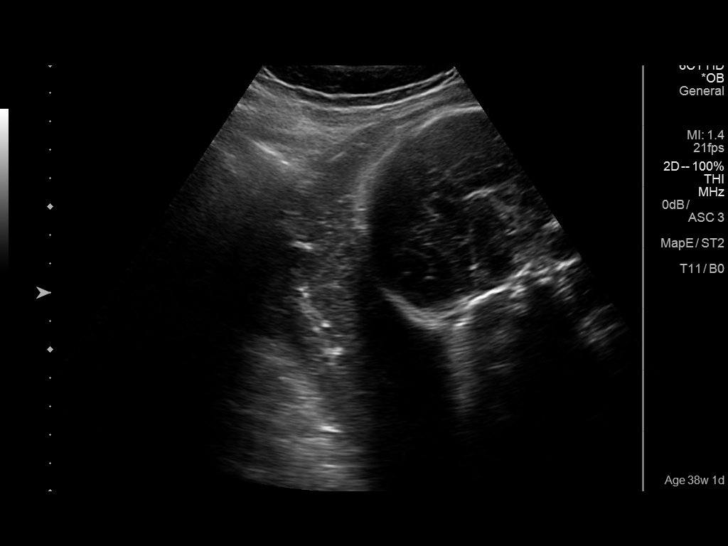
[im 9/26]
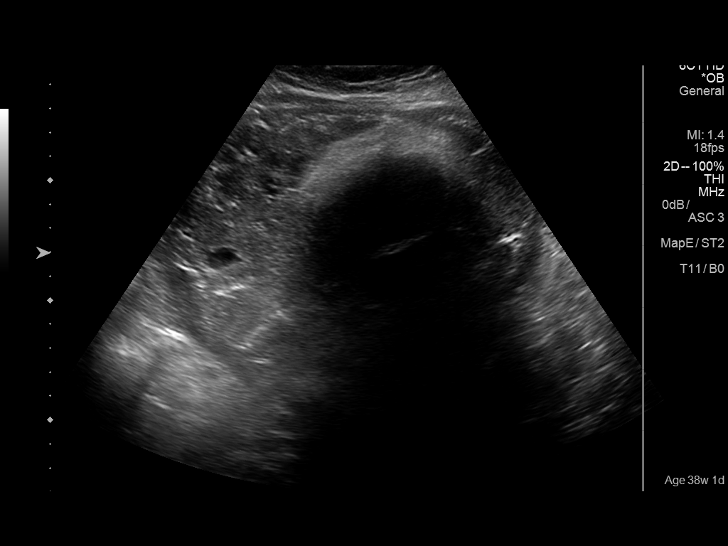
[im 11/26]
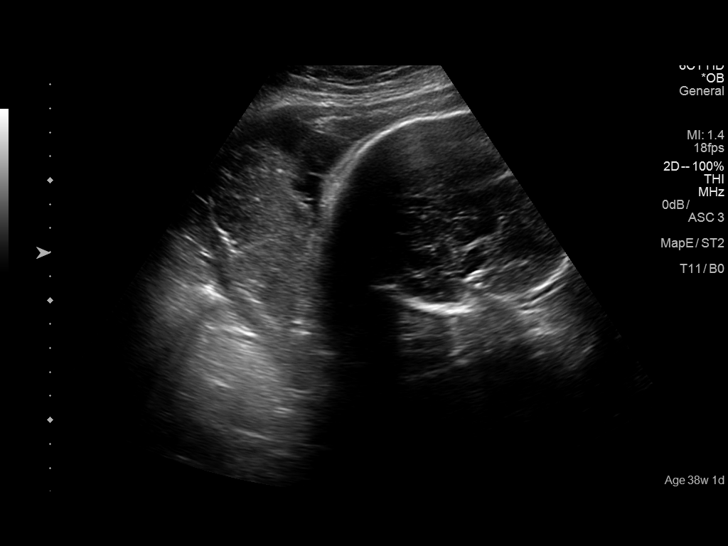
[im 13/26]
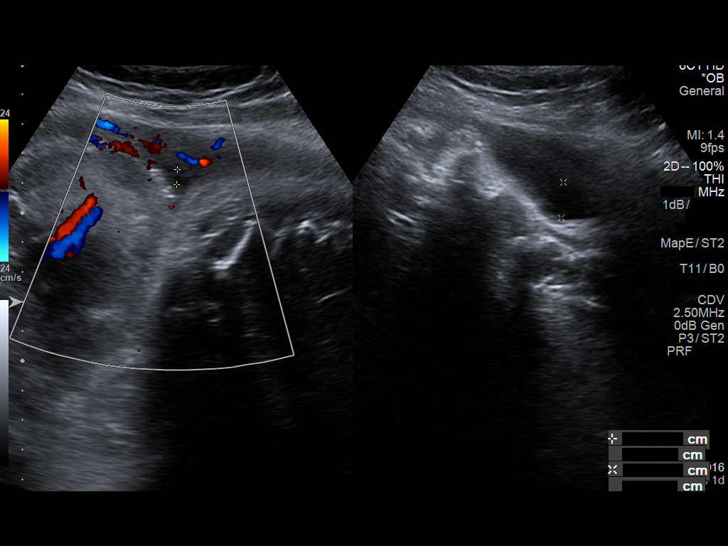
[im 14/26]
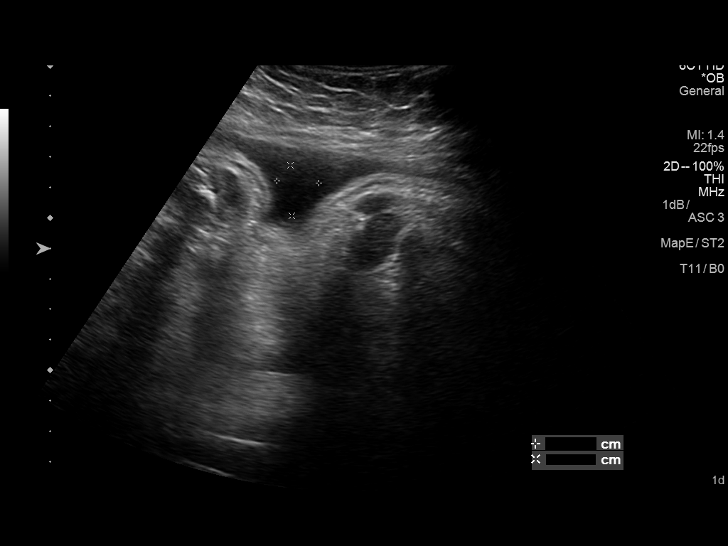
[im 16/26]
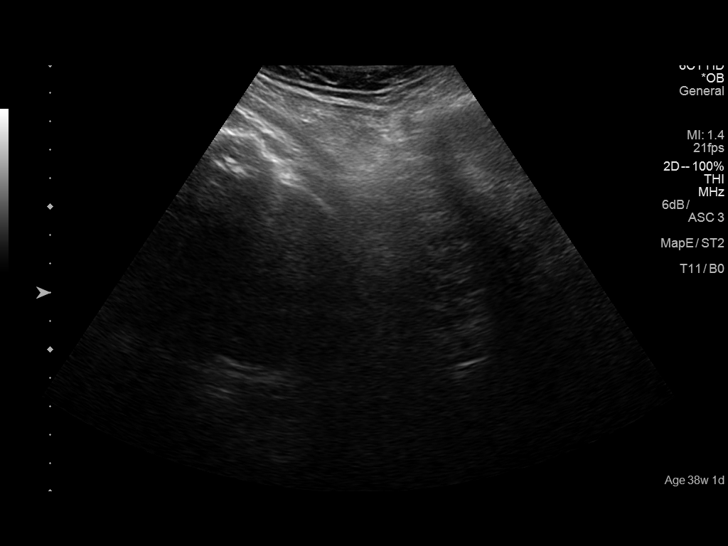
[im 18/26]
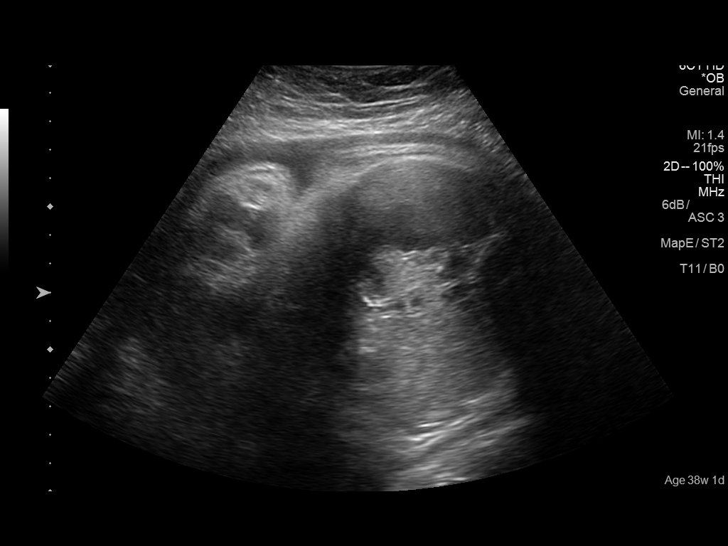
[im 20/26]
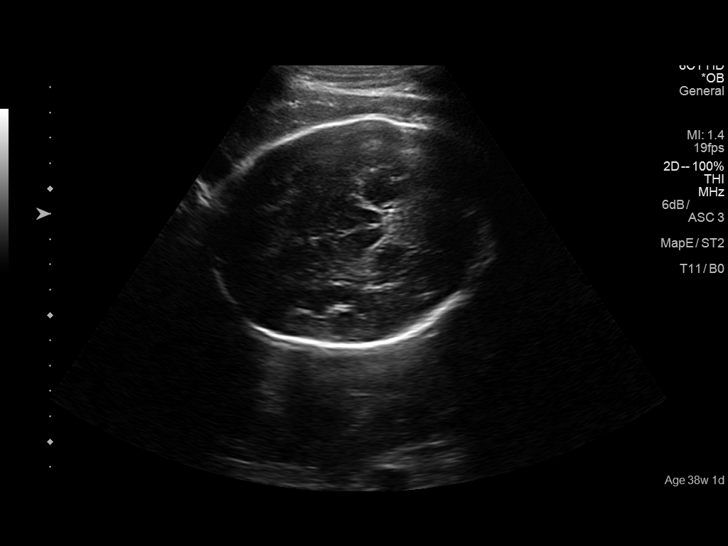
[im 22/26]
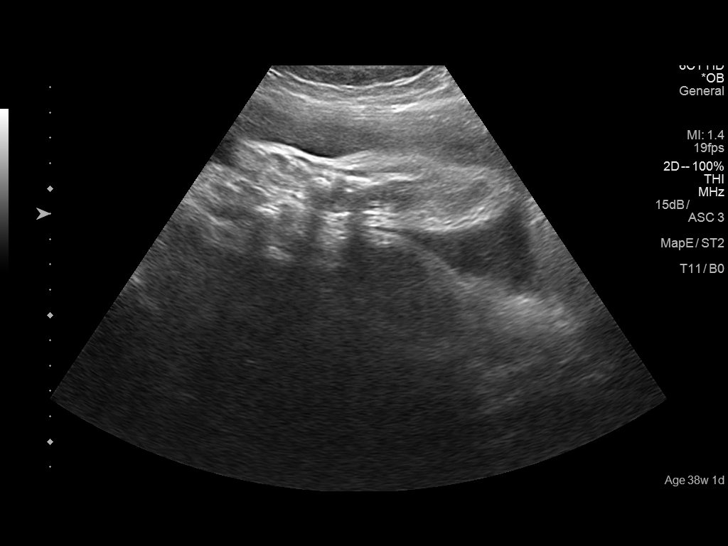
[im 24/26]
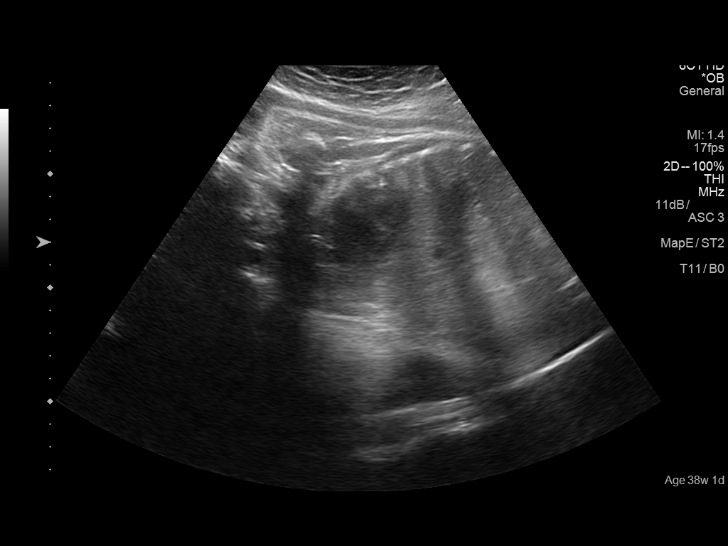
[im 26/26]
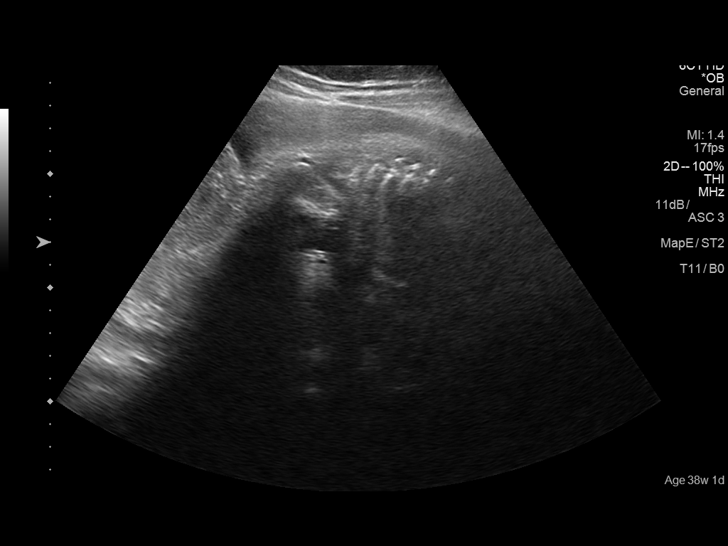

[14 of 26 positions shown; findings below may reference images not displayed]

FINDINGS: Number of Fetuses: 1

Heart Rate:  127 bpm

Movement: Yes

Presentation: Breech

Placental Location: Fundal

Previa: No

Amniotic Fluid (Subjective): Decreased. Amniotic fluid index of
cm. The largest fluid pocket measures 1.4 x 1.7 cm.

BPD:  8.95cm 36w  2d

MATERNAL FINDINGS:

Cervix:  Not evaluated.

Uterus/Adnexae:  No abnormality visualized.
IMPRESSION: 1. Single live intrauterine pregnancy in breech presentation,
estimated gestational age based on BPD at 36 weeks 2 days.
2. Oligohydramnios with amniotic fluid index of 4.5 cm. This is less
than the fifth percentile for gestational age. Largest fluid pocket
is 1.4 x 1.7 cm.

This exam is performed on an emergent basis and does not
comprehensively evaluate fetal size, dating, or anatomy; follow-up
complete OB US should be considered if further fetal assessment is
warranted.

## 2017-10-01 ENCOUNTER — Encounter: Payer: Self-pay | Admitting: Family Medicine

## 2017-10-01 ENCOUNTER — Ambulatory Visit: Payer: BC Managed Care – PPO | Admitting: Family Medicine

## 2017-10-01 VITALS — BP 112/68 | HR 102 | Temp 98.0°F | Resp 12 | Ht 60.0 in | Wt 138.0 lb

## 2017-10-01 DIAGNOSIS — J22 Unspecified acute lower respiratory infection: Secondary | ICD-10-CM | POA: Diagnosis not present

## 2017-10-01 DIAGNOSIS — I340 Nonrheumatic mitral (valve) insufficiency: Secondary | ICD-10-CM | POA: Diagnosis not present

## 2017-10-01 DIAGNOSIS — I071 Rheumatic tricuspid insufficiency: Secondary | ICD-10-CM | POA: Insufficient documentation

## 2017-10-01 DIAGNOSIS — I371 Nonrheumatic pulmonary valve insufficiency: Secondary | ICD-10-CM | POA: Diagnosis not present

## 2017-10-01 DIAGNOSIS — I351 Nonrheumatic aortic (valve) insufficiency: Secondary | ICD-10-CM

## 2017-10-01 NOTE — Patient Instructions (Signed)
We'll have you see Dr. Gwen PoundsKowalski for the valvular issue

## 2017-10-01 NOTE — Progress Notes (Signed)
BP 112/68   Pulse (!) 102   Temp 98 F (36.7 C) (Oral)   Resp 12   Ht 5' (1.524 m)   Wt 138 lb (62.6 kg)   LMP 09/05/2017   SpO2 97%   BMI 26.95 kg/m    Subjective:    Patient ID: Autumn Todd, female    DOB: [birth year], 32 y.o.   MRN: [id number]  HPI: Autumn Todd is a 32 y.o. female  Chief Complaint  Patient presents with  . Sore Throat    stopped on sunday and has not hurt since    HPI Patient is here for an acute visit She had a sore throat last week; all better now Took ibuprofen and helped Some cough, not really coughing up much; had a cold and thought she was over it and then came and went This is finally going away She is a Runner, broadcasting/film/video, first grade Did not have swollen glands under the neck, nontender Never saw any redness Little discomfort when swallowing, but not extremely painful, no trouble swallowing No fevers Did not check temp, but can tell when she gets hot; doesn't think she had a fever Had a sinus headache for about two days and that's gone; over forehead No ear symptoms No rash No joint pains No body aches  Upon examination, I heard accentuated heart sounds and asked her about any cardiac history; she says she had lots of testing a few years ago at [facility]; she had a holter monitor and echo; saw Dr. Gwen Pounds; last note reviewed Echocardiogram; trivial AR, MR, TR, PR  Depression screen [facility] 2/9 10/01/2017 02/07/2017 06/06/2015 02/17/2015 10/11/2014  Decreased Interest 0 0 0 0 0  Down, Depressed, Hopeless 0 0 0 0 0  PHQ - 2 Score 0 0 0 0 0    Relevant past medical, surgical, family and social history reviewed Past Medical History:  Diagnosis Date  . Allergy   . Anemia 07/24/2015  . Asthma   . Sinus tachycardia    Past Surgical History:  Procedure Laterality Date  . CESAREAN SECTION N/A 04/27/2015   Procedure: CESAREAN SECTION;  Surgeon: [place] Bing, MD;  Location: [facility] ORS;  Service: Obstetrics;  Laterality: N/A;  . WISDOM TOOTH  EXTRACTION     Family History  Problem Relation Age of Onset  . Cancer Maternal Grandfather    Social History   Tobacco Use  . Smoking status: Never Smoker  . Smokeless tobacco: Never Used  Substance Use Topics  . Alcohol use: No    Alcohol/week: 0.0 oz  . Drug use: No    Interim medical history since last visit reviewed. Allergies and medications reviewed  Review of Systems Per HPI unless specifically indicated above     Objective:    BP 112/68   Pulse (!) 102   Temp 98 F (36.7 C) (Oral)   Resp 12   Ht 5' (1.524 m)   Wt 138 lb (62.6 kg)   LMP 09/05/2017   SpO2 97%   BMI 26.95 kg/m   Wt Readings from Last 3 Encounters:  10/01/17 138 lb (62.6 kg)  02/07/17 135 lb 12.8 oz (61.6 kg)  10/31/16 141 lb (64 kg)    Physical Exam  Constitutional: She appears well-developed and well-nourished.  HENT:  Head: Normocephalic and atraumatic.  Right Ear: Tympanic membrane and ear canal normal.  Left Ear: Tympanic membrane and ear canal normal.  Mouth/Throat: Uvula is midline, oropharynx is clear and moist and mucous membranes are normal. Mucous  membranes are not pale and not dry. No uvula swelling. No oropharyngeal exudate, posterior oropharyngeal edema, posterior oropharyngeal erythema or tonsillar abscesses. Tonsils are 0 on the right. Tonsils are 0 on the left. No tonsillar exudate.  Eyes: EOM are normal. No scleral icterus.  Cardiovascular: Regular rhythm. Tachycardia present.  Accentuated/split S1, noticeable most in the LUSB and LLSB; no midsystolic click over apex  Pulmonary/Chest: Effort normal and breath sounds normal.  Lymphadenopathy:    She has no cervical adenopathy.  Psychiatric: She has a normal mood and affect. Her behavior is normal.      Assessment & Plan:   Problem List Items Addressed This Visit      Cardiovascular and Mediastinum   RESOLVED: Tricuspid valve insufficiency   Relevant Orders   Ambulatory referral to Cardiology   RESOLVED: Pulmonary  valve insufficiency   Relevant Orders   Ambulatory referral to Cardiology   RESOLVED: Mitral valve insufficiency   Relevant Orders   Ambulatory referral to Cardiology   Aortic valve regurgitation   Relevant Orders   Ambulatory referral to Cardiology    Other Visit Diagnoses    Acute respiratory infection    -  Primary   nothing in history or on exam to suggest this was group A strep; suspect viral etiology; resolving; no treatment needed      System is having issue with AR, MR, PR, and TR; refer to cardiologist for all of these issues and abnormal heart sounds on exam  Follow up plan: Return if symptoms worsen or fail to improve.  An after-visit summary was printed and given to the patient at check-out.  Please see the patient instructions which may contain other information and recommendations beyond what is mentioned above in the assessment and plan.  No orders of the defined types were placed in this encounter.   Orders Placed This Encounter  Procedures  . Ambulatory referral to Cardiology

## 2017-10-01 NOTE — Assessment & Plan Note (Signed)
Refer to cardiologist. °

## 2018-01-31 ENCOUNTER — Other Ambulatory Visit: Payer: Self-pay | Admitting: Family Medicine

## 2018-02-13 ENCOUNTER — Encounter: Payer: Self-pay | Admitting: Nurse Practitioner

## 2018-02-13 ENCOUNTER — Ambulatory Visit: Payer: BC Managed Care – PPO | Admitting: Nurse Practitioner

## 2018-02-13 VITALS — BP 120/72 | HR 94 | Temp 97.9°F | Resp 12 | Ht 60.0 in | Wt 139.3 lb

## 2018-02-13 DIAGNOSIS — R110 Nausea: Secondary | ICD-10-CM | POA: Diagnosis not present

## 2018-02-13 DIAGNOSIS — F419 Anxiety disorder, unspecified: Secondary | ICD-10-CM

## 2018-02-13 LAB — COMPLETE METABOLIC PANEL WITH GFR
AG RATIO: 1.9 (calc) (ref 1.0–2.5)
ALBUMIN MSPROF: 4.7 g/dL (ref 3.6–5.1)
ALKALINE PHOSPHATASE (APISO): 69 U/L (ref 33–115)
ALT: 13 U/L (ref 6–29)
AST: 15 U/L (ref 10–30)
BILIRUBIN TOTAL: 0.4 mg/dL (ref 0.2–1.2)
BUN: 11 mg/dL (ref 7–25)
CHLORIDE: 105 mmol/L (ref 98–110)
CO2: 26 mmol/L (ref 20–32)
Calcium: 9.7 mg/dL (ref 8.6–10.2)
Creat: 0.76 mg/dL (ref 0.50–1.10)
GFR, EST AFRICAN AMERICAN: 123 mL/min/{1.73_m2} (ref >=60)
GFR, Est Non African American: 106 mL/min/{1.73_m2} (ref >=60)
GLOBULIN: 2.5 g/dL (ref 1.9–3.7)
Glucose, Bld: 97 mg/dL (ref 65–139)
POTASSIUM: 4.1 mmol/L (ref 3.5–5.3)
SODIUM: 139 mmol/L (ref 135–146)
TOTAL PROTEIN: 7.2 g/dL (ref 6.1–8.1)

## 2018-02-13 MED ORDER — ONDANSETRON HCL 4 MG PO TABS: 4.0000 mg | ORAL_TABLET | Freq: Three times a day (TID) | ORAL | 0 refills | Status: DC | PRN

## 2018-02-13 MED ORDER — BUSPIRONE HCL 7.5 MG PO TABS: 7.5000 mg | ORAL_TABLET | Freq: Two times a day (BID) | ORAL | 1 refills | Status: DC

## 2018-02-13 NOTE — Progress Notes (Signed)
Name: Autumn Todd   MRN: [id number]    DOB: [birth year]   Date:02/13/2018       Progress Note  Subjective  Chief Complaint  Chief Complaint  Patient presents with  . Anxiety    staring meds    HPI  Patient endorses episodes of getting sick to her stomach- had nausea no vomiting, decreased appetite. No stomach pain, mild diarrhea this time. States had has these episodes three different times this school year, this last episode started 3 days ago- feeling improved today. She ir presently on her period. Typically morning nausea, tries to eat some crackers can help a little. No headaches, blurry vision, no foul taste in your mouth.   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 Requesting something for anxiety; denies panic attacks.    GAD 7 : Generalized Anxiety Score 02/13/2018  Nervous, Anxious, on Edge 1  Control/stop worrying 1  Worry too much - different things 1  Trouble relaxing 0  Restless 0  Easily annoyed or irritable 0  Afraid - awful might happen 0  Total GAD 7 Score 3  Anxiety Difficulty Somewhat difficult    Depression screen [facility] 2/9 02/13/2018 02/13/2018 10/01/2017  Decreased Interest 0 0 0  Down, Depressed, Hopeless 1 1 0  PHQ - 2 Score 1 1 0  Altered sleeping 0 - -  Tired, decreased energy 1 - -  Change in appetite 1 - -  Feeling bad or failure about yourself  0 - -  Trouble concentrating 0 - -  Moving slowly or fidgety/restless 0 - -  Suicidal thoughts 0 - -  PHQ-9 Score 3 - -  Difficult doing work/chores Somewhat difficult - -     Patient Active Problem List   Diagnosis Date Noted  . Aortic valve regurgitation 10/01/2017  . Mitral regurgitation 10/01/2017  . 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 Anemia 07/24/2015  . Tachycardia 07/24/2015  . Blurred vision 07/24/2015  . De Quervain's tenosynovitis, left 06/06/2015  . Eczema of right hand 06/06/2015  .  Cesarean delivery delivered 04/28/2015  . Asthma, mild persistent 10/11/2014    Past Medical History:  Diagnosis Date  . Allergy   . Anemia 07/24/2015  . Asthma   . Sinus tachycardia     Past Surgical History:  Procedure Laterality Date  . CESAREAN SECTION N/A 04/27/2015   Procedure: CESAREAN SECTION;  Surgeon: Marietta Bing, MD;  Location: [facility] ORS;  Service: Obstetrics;  Laterality: N/A;  . WISDOM TOOTH EXTRACTION      Social History   Tobacco Use  . Smoking status: Never Smoker  . Smokeless tobacco: Never Used  Substance Use Topics  . Alcohol use: No    Alcohol/week: 0.0 standard drinks     Current Outpatient Medications:  .  levocetirizine (XYZAL) 5 MG tablet, Take 1 tablet (5 mg total) by mouth daily as needed for allergies., Disp: 30 tablet, Rfl: 6 .  montelukast (SINGULAIR) 10 MG tablet, Take 1 tablet (10 mg total) by mouth at bedtime. For allergies, Disp: 30 tablet, Rfl: 11 .  LO LOESTRIN FE 1 MG-10 MCG / 10 MCG tablet, Take 1 tablet by mouth daily. (Patient not taking: Reported on 10/01/2017), Disp: 84 tablet, Rfl: 3 .  scopolamine (TRANSDERM-SCOP, 1.5 MG,) 1 MG/3DAYS, Place 1 patch (1.5 mg total) onto the skin every 3 (three) days. (Patient not taking: Reported on 10/01/2017), Disp: 4 patch, Rfl: 0  No Known Allergies  ROS   No other specific complaints in a complete review of systems (except as listed in HPI above).  Objective  Vitals:   02/13/18 1524  BP: 120/72  Pulse: 94  Resp: 12  Temp: 97.9 F (36.6 C)  TempSrc: Oral  SpO2: 94%  Weight: 139 lb 4.8 oz (63.2 kg)  Height: 5' (1.524 m)    Body mass index is 27.21 kg/m.  Nursing Note and Vital Signs reviewed.  Physical Exam  Constitutional: She is oriented to person, place, and time. She appears well-developed and well-nourished.  HENT:  Head: Normocephalic and atraumatic.  Cardiovascular: Normal rate, regular rhythm and intact distal pulses.  Pulmonary/Chest: Effort normal and breath  sounds normal.  Abdominal: Soft. Bowel sounds are normal. There is no tenderness. There is no CVA tenderness, no tenderness at McBurney's point and negative Murphy's sign.  Neurological: She is alert and oriented to person, place, and time.  Skin: Skin is warm and dry.  Psychiatric: She has a normal mood and affect. Her behavior is normal. Judgment and thought content normal.  Nursing note and vitals reviewed.    No results found for this or any previous visit (from the past 48 hour(s)).  Assessment & Plan  1. Anxiety -counseling - busPIRone (BUSPAR) 7.5 MG tablet; Take 1 tablet (7.5 mg total) by mouth 2 (two) times daily.  Dispense: 60 tablet; Refill: 1  2. Nausea - COMPLETE METABOLIC PANEL WITH GFR - ondansetron (ZOFRAN) 4 MG tablet; Take 1 tablet (4 mg total) by mouth every 8 (eight) hours as needed for nausea or vomiting.  Dispense: 20 tablet; Refill: 0   Follow up in one month.

## 2018-02-13 NOTE — Patient Instructions (Addendum)
- Take buspar twice a day for anxiety - www.psychologytoday.com to help locate counselor; [facility] is also a great option.  - Take zofran as needed for nausea   Nausea, Adult Feeling sick to your stomach (nausea) means that your stomach is upset or you feel like you have to throw up (vomit). Feeling sick to your stomach is usually not serious, but it may be an early sign of a more serious medical problem. As you feel sicker to your stomach, it can lead to throwing up (vomiting). If you throw up, or if you are not able to drink enough fluids, there is a risk of dehydration. Dehydration can make you feel tired and thirsty, have a dry mouth, and pee (urinate) less often. Older adults and people who have other diseases or a weak defense (immune) system have a higher risk of dehydration. The main goal of treating this condition is to:  Limit how often you feel sick to your stomach.  Prevent throwing up and dehydration.  Follow these instructions at home: Follow instructions from your doctor about how to care for yourself at home. Eating and drinking Follow these recommendations as told by your doctor:  Take an oral rehydration solution (ORS). This is a drink that is sold at pharmacies and stores.  Drink clear fluids in small amounts as you are able, such as: ? Water. ? Ice chips. ? Fruit juice that has water added (diluted fruit juice). ? Low-calorie sports drinks.  Eat bland, easy to digest foods in small amounts as you are able, such as: ? Bananas. ? Applesauce. ? Rice. ? Lean meats. ? Toast. ? Crackers.  Avoid drinking fluids that contain a lot of sugar or caffeine.  Avoid alcohol.  Avoid spicy or fatty foods.  General instructions  Drink enough fluid to keep your pee (urine) clear or pale yellow.  Wash your hands often. If you cannot use soap and water, use hand sanitizer.  Make sure that all people in your household wash their hands well and often.  Rest  at home while you get better.  Take over-the-counter and prescription medicines only as told by your doctor.  Breathe slowly and deeply when you feel sick to your stomach.  Watch your condition for any changes.  Keep all follow-up visits as told by your doctor. This is important. Contact a doctor if:  You have a headache.  You have new symptoms.  You feel sicker to your stomach.  You have a fever.  You feel light-headed or dizzy.  You throw up.  You are not able to keep fluids down. Get help right away if:  You have pain in your chest, neck, arm, or jaw.  You feel very weak or you pass out (faint).  You have throw up that is bright red or looks like coffee grounds.  You have bloody or black poop (stools), or poop that looks like tar.  You have a very bad headache, a stiff neck, or both.  You have very bad pain, cramping, or bloating in your belly.  You have a rash.  You have trouble breathing or you are breathing very quickly.  Your heart is beating very quickly.  Your skin feels cold and clammy.  You feel confused.  You have pain while peeing.  You have signs of dehydration, such as: ? Dark pee, or very little or no pee. ? Cracked lips. ? Dry mouth. ? Sunken eyes. ? Sleepiness. ? Weakness. These symptoms may be  an emergency. Do not wait to see if the symptoms will go away. Get medical help right away. Call your local emergency services (911 in the [place] Do not drive yourself to the hospital. This information is not intended to replace advice given to you by your health care provider. Make sure you discuss any questions you have with your health care provider. Document Released: 03/21/2011 Document Revised: 09/07/2015 Document Reviewed: 12/06/2014 Elsevier Interactive Patient Education  [employer].

## 2018-02-16 ENCOUNTER — Ambulatory Visit: Payer: BC Managed Care – PPO | Admitting: Nurse Practitioner

## 2018-03-08 ENCOUNTER — Other Ambulatory Visit: Payer: Self-pay | Admitting: Family Medicine

## 2018-03-11 ENCOUNTER — Ambulatory Visit: Payer: BC Managed Care – PPO | Admitting: Family Medicine

## 2018-03-11 ENCOUNTER — Encounter: Payer: Self-pay | Admitting: Family Medicine

## 2018-03-11 DIAGNOSIS — F419 Anxiety disorder, unspecified: Secondary | ICD-10-CM

## 2018-03-11 DIAGNOSIS — I351 Nonrheumatic aortic (valve) insufficiency: Secondary | ICD-10-CM

## 2018-03-11 HISTORY — DX: Anxiety disorder, unspecified: F41.9

## 2018-03-11 NOTE — Assessment & Plan Note (Signed)
Managed by cardiologist; murmur appreciated today

## 2018-03-11 NOTE — Assessment & Plan Note (Signed)
Patient seems to be doing well with the buspirone; advised her to spread the doses out a little further apart, at least 8 hours; she has a refill and we'll be glad to send in more refills when needed in late December; see AVS for further recommendations; return in 6 months

## 2018-03-11 NOTE — Patient Instructions (Signed)
12 Ways to Curb Anxiety  ?Anxiety is normal human sensation. It is what helped our ancestors survive the pitfalls of the wilderness. Anxiety is defined as experiencing worry or nervousness about an imminent event or something with an uncertain outcome. It is a feeling experienced by most people at some point in their lives. Anxiety can be triggered by a very personal issue, such as the illness of a loved one, or an event of global proportions, such as a refugee crisis. Some of the symptoms of anxiety are:  Feeling restless.  Having a feeling of impending danger.  Increased heart rate.  Rapid breathing. Sweating.  Shaking.  Weakness or feeling tired.  Difficulty concentrating on anything except the current worry.  Insomnia.  Stomach or bowel problems. What can we do about anxiety we may be feeling? There are many techniques to help manage stress and relax. Here are 12 ways you can reduce your anxiety almost immediately: 1. Turn off the constant feed of information. Take a social media sabbatical. Studies have shown that social media directly contributes to social anxiety.  2. Monitor your television viewing habits. Are you watching shows that are also contributing to your anxiety, such as 24-hour news stations? Try watching something else, or better yet, nothing at all. Instead, listen to music, read an inspirational book or practice a hobby. 3. Eat nutritious meals. Also, don't skip meals and keep healthful snacks on hand. Hunger and poor diet contributes to feeling anxious. 4. Sleep. Sleeping on a regular schedule for at least seven to eight hours a night will do wonders for your outlook when you are awake. 5. Exercise. Regular exercise will help rid your body of that anxious energy and help you get more restful sleep. 6. Try deep (diaphragmatic) breathing. Inhale slowly through your nose for five seconds and exhale through your mouth. 7. Practice acceptance and gratitude. When anxiety hits,  accept that there are things out of your control that shouldn't be of immediate concern.  8. Seek out humor. When anxiety strikes, watch a funny video, read jokes or call a friend who makes you laugh. Laughter is healing for our bodies and releases endorphins that are calming. 9. Stay positive. Take the effort to replace negative thoughts with positive ones. Try to see a stressful situation in a positive light. Try to come up with solutions rather than dwelling on the problem. 10. Figure out what triggers your anxiety. Keep a journal and make note of anxious moments and the events surrounding them. This will help you identify triggers you can avoid or even eliminate. 11. Talk to someone. Let a trusted friend, family member or even trained professional know that you are feeling overwhelmed and anxious. Verbalize what you are feeling and why.  12. Volunteer. If your anxiety is triggered by a crisis on a large scale, become an advocate and work to resolve the problem that is causing you unease. Anxiety is often unwelcome and can become overwhelming. If not kept in check, it can become a disorder that could require medical treatment. However, if you take the time to care for yourself and avoid the triggers that make you anxious, you will be able to find moments of relaxation and clarity that make your life much more enjoyable.   

## 2018-03-11 NOTE — Progress Notes (Signed)
BP 110/68   Pulse 99   Temp 98 F (36.7 C) (Oral)   Ht 5' (1.524 m)   Wt 140 lb 14.4 oz (63.9 kg)   LMP 02/13/2018   SpO2 99%   BMI 27.52 kg/m    Subjective:    Patient ID: Autumn Todd, female    DOB: [birth year], 32 y.o.   MRN: [id number]  HPI: Autumn Todd is a 32 y.o. female  Chief Complaint  Patient presents with  . Follow-up    HPI Here for f/u She saw Sharyon Cable, DNP earlier this month She was having anxiety; sick feeling; would last a few days; May, June, September, then started every week; lots going on with school Started medicine, and has had only one issue since then; she takes it morning and early afternoon but would like to spread it out Feeling less overwhelmed Very overwhelmed at school Got off birth control in the spring Sister is getting married soon Sharyon Cable, DNP started her on buspirone CMP was done which was completely normal  Seeing Dr. Gwen Pounds for valvular and tachycardia; he said "I was good" she relates    Depression screen [facility] 2/9 03/11/2018 02/13/2018 02/13/2018 10/01/2017 02/07/2017  Decreased Interest 0 0 0 0 0  Down, Depressed, Hopeless 0 1 1 0 0  PHQ - 2 Score 0 1 1 0 0  Altered sleeping 0 0 - - -  Tired, decreased energy 0 1 - - -  Change in appetite 0 1 - - -  Feeling bad or failure about yourself  0 0 - - -  Trouble concentrating 0 0 - - -  Moving slowly or fidgety/restless 0 0 - - -  Suicidal thoughts 0 0 - - -  PHQ-9 Score 0 3 - - -  Difficult doing work/chores Not difficult at all Somewhat difficult - - -   Fall Risk  03/11/2018 02/13/2018 10/01/2017 02/07/2017 06/06/2015  Falls in the past year? 0 0 No No No  Number falls in past yr: 0 0 - - -    Relevant past medical, surgical, family and social history reviewed Past Medical History:  Diagnosis Date  . Allergy   . Anemia 07/24/2015  . Anxiety 03/11/2018  . Asthma   . Sinus tachycardia    Past Surgical History:  Procedure Laterality Date  .  CESAREAN SECTION N/A 04/27/2015   Procedure: CESAREAN SECTION;  Surgeon: [place] Bing, MD;  Location: [facility] ORS;  Service: Obstetrics;  Laterality: N/A;  . WISDOM TOOTH EXTRACTION     Family History  Problem Relation Age of Onset  . Cancer Maternal Grandfather    Social History   Tobacco Use  . Smoking status: Never Smoker  . Smokeless tobacco: Never Used  Substance Use Topics  . Alcohol use: No    Alcohol/week: 0.0 standard drinks  . Drug use: No     Office Visit from 03/11/2018 in [facility]  AUDIT-C Score  0      Interim medical history since last visit reviewed. Allergies and medications reviewed  Review of Systems Per HPI unless specifically indicated above     Objective:    BP 110/68   Pulse 99   Temp 98 F (36.7 C) (Oral)   Ht 5' (1.524 m)   Wt 140 lb 14.4 oz (63.9 kg)   LMP 02/13/2018   SpO2 99%   BMI 27.52 kg/m   Wt Readings from Last 3 Encounters:  03/11/18 140 lb 14.4 oz (63.9  kg)  02/13/18 139 lb 4.8 oz (63.2 kg)  10/01/17 138 lb (62.6 kg)    Physical Exam  Constitutional: She appears well-developed and well-nourished.  HENT:  Mouth/Throat: Mucous membranes are normal.  Eyes: EOM are normal. No scleral icterus.  Cardiovascular: Normal rate and regular rhythm.  Murmur heard. Pulmonary/Chest: Effort normal and breath sounds normal.  Psychiatric: She has a normal mood and affect. Her behavior is normal. Her mood appears not anxious. She does not exhibit a depressed mood.    Results for orders placed or performed in visit on 02/13/18  COMPLETE METABOLIC PANEL WITH GFR  Result Value Ref Range   Glucose, Bld 97 65 - 139 mg/dL   BUN 11 7 - 25 mg/dL   Creat 1.610.76 0.960.50 - 0.451.10 mg/dL   GFR, Est Non African American 106 > OR = 60 mL/min/1.6673m2   GFR, Est African American 123 > OR = 60 mL/min/1.5973m2   BUN/Creatinine Ratio NOT APPLICABLE 6 - 22 (calc)   Sodium 139 135 - 146 mmol/L   Potassium 4.1 3.5 - 5.3 mmol/L   Chloride 105  98 - 110 mmol/L   CO2 26 20 - 32 mmol/L   Calcium 9.7 8.6 - 10.2 mg/dL   Total Protein 7.2 6.1 - 8.1 g/dL   Albumin 4.7 3.6 - 5.1 g/dL   Globulin 2.5 1.9 - 3.7 g/dL (calc)   AG Ratio 1.9 1.0 - 2.5 (calc)   Total Bilirubin 0.4 0.2 - 1.2 mg/dL   Alkaline phosphatase (APISO) 69 33 - 115 U/L   AST 15 10 - 30 U/L   ALT 13 6 - 29 U/L      Assessment & Plan:   Problem List Items Addressed This Visit      Cardiovascular and Mediastinum   Aortic valve regurgitation    Managed by cardiologist; murmur appreciated today        Other   Anxiety    Patient seems to be doing well with the buspirone; advised her to spread the doses out a little further apart, at least 8 hours; she has a refill and we'll be glad to send in more refills when needed in late December; see AVS for further recommendations; return in 6 months          Follow up plan: Return in about 6 months (around 09/09/2018).  An after-visit summary was printed and given to the patient at check-out.  Please see the patient instructions which may contain other information and recommendations beyond what is mentioned above in the assessment and plan.  No orders of the defined types were placed in this encounter.   No orders of the defined types were placed in this encounter.

## 2018-03-26 ENCOUNTER — Encounter: Payer: Self-pay | Admitting: Family Medicine

## 2018-03-26 MED ORDER — BUSPIRONE HCL 10 MG PO TABS: 10.0000 mg | ORAL_TABLET | Freq: Three times a day (TID) | ORAL | 2 refills | Status: DC | PRN

## 2018-06-11 ENCOUNTER — Encounter: Payer: Self-pay | Admitting: Family Medicine

## 2018-06-11 MED ORDER — BUSPIRONE HCL 15 MG PO TABS: 15.0000 mg | ORAL_TABLET | Freq: Three times a day (TID) | ORAL | 2 refills | Status: DC

## 2018-07-27 ENCOUNTER — Encounter: Payer: BC Managed Care – PPO | Admitting: Certified Nurse Midwife

## 2018-08-13 ENCOUNTER — Telehealth: Payer: Self-pay | Admitting: *Deleted

## 2018-08-13 NOTE — Telephone Encounter (Signed)
Coronavirus (COVID-19) Are you at risk?  Are you at risk for the Coronavirus (COVID-19)?  To be considered HIGH RISK for Coronavirus (COVID-19), you have to meet the following criteria:  . Traveled to [place], [place], [place], [place] or [place]; or in the [place] to [place], [place], [place], or [place]; and have fever, cough, and shortness of breath within the last 2 weeks of travel OR . Been in close contact with a person diagnosed with COVID-19 within the last 2 weeks and have fever, cough, and shortness of breath . IF YOU DO NOT MEET THESE CRITERIA, YOU ARE CONSIDERED LOW RISK FOR COVID-19.  What to do if you are HIGH RISK for COVID-19?  . If you are having a medical emergency, call 911. . Seek medical care right away. Before you go to a doctor's office, urgent care or emergency department, call ahead and tell them about your recent travel, contact with someone diagnosed with COVID-19, and your symptoms. You should receive instructions from your physician's office regarding next steps of care.  . When you arrive at healthcare provider, tell the healthcare staff immediately you have returned from visiting [place], [place], [place], [place] or [place]; or traveled in the [place] to [place], [place], [place], or [place]; in the last two weeks or you have been in close contact with a person diagnosed with COVID-19 in the last 2 weeks.   . Tell the health care staff about your symptoms: fever, cough and shortness of breath. . After you have been seen by a medical provider, you will be either: o Tested for (COVID-19) and discharged home on quarantine except to seek medical care if symptoms worsen, and asked to  - Stay home and avoid contact with others until you get your results (4-5 days)  - Avoid travel on public transportation if possible (such as bus, train, or airplane) or o Sent to the Emergency Department by EMS for evaluation, COVID-19 testing, and possible  admission depending on your condition and test results.  What to do if you are LOW RISK for COVID-19?  Reduce your risk of any infection by using the same precautions used for avoiding the common cold or flu:  . Wash your hands often with soap and warm water for at least 20 seconds.  If soap and water are not readily available, use an alcohol-based hand sanitizer with at least 60% alcohol.  . If coughing or sneezing, cover your mouth and nose by coughing or sneezing into the elbow areas of your shirt or coat, into a tissue or into your sleeve (not your hands). . Avoid shaking hands with others and consider head nods or verbal greetings only. . Avoid touching your eyes, nose, or mouth with unwashed hands.  . Avoid close contact with people who are sick. . Avoid places or events with large numbers of people in one location, like concerts or sporting events. . Carefully consider travel plans you have or are making. . If you are planning any travel outside or inside the [place], visit the CDC's Travelers' Health webpage for the latest health notices. . If you have some symptoms but not all symptoms, continue to monitor at home and seek medical attention if your symptoms worsen. . If you are having a medical emergency, call 911.   ADDITIONAL HEALTHCARE OPTIONS FOR PATIENTS  [facility] / e-Visit: [email or web address]         [facility]: [phone]  [facility]: [phone]                   [facility]: [phone]   Spoke with pt denies any sx.   , CMA 

## 2018-08-14 ENCOUNTER — Ambulatory Visit: Payer: BC Managed Care – PPO | Admitting: Certified Nurse Midwife

## 2018-08-14 ENCOUNTER — Encounter: Payer: Self-pay | Admitting: Certified Nurse Midwife

## 2018-08-14 ENCOUNTER — Other Ambulatory Visit: Payer: Self-pay

## 2018-08-14 VITALS — BP 107/62 | HR 115 | Ht 60.0 in | Wt 148.0 lb

## 2018-08-14 DIAGNOSIS — Z319 Encounter for procreative management, unspecified: Secondary | ICD-10-CM

## 2018-08-14 NOTE — Patient Instructions (Signed)
Female Infertility    Female infertility refers to a woman's inability to get pregnant (conceive) after a year of having sex regularly (or after 6 months in women over age 31) without using birth control. Infertility can also mean that a woman is not able to carry a pregnancy to full term.  Both women and men can have fertility problems.  What are the causes?  This condition may be caused by:  Problems with reproductive organs. Infertility can result if a woman:  Has an abnormally short cervix or a cervix that does not remain closed during a pregnancy.  Has a blockage or scarring in the fallopian tubes.  Has an abnormally shaped uterus.  Has uterine fibroids. This is a benign mass of tissue or muscle (tumor) that can develop in the uterus.  Is not ovulating in a regular way.  Certain medical conditions. These may include:  Polycystic ovary syndrome (PCOS). This is a hormonal disorder that can cause small cysts to grow on the ovaries. This is the most common cause of infertility in women.  Endometriosis. This is a condition in which the tissue that lines the uterus (endometrium) grows outside of its normal location.  Cancer and cancer treatments, such as chemotherapy or radiation.  Premature ovarian failure. This is when ovaries stop producing eggs and hormones before age 40.  Sexually transmitted diseases, such as chlamydia or gonorrhea.  Autoimmune disorders. These are disorders in which the body's defense system (immune system) attacks normal, healthy cells.  Infertility can be linked to more than one cause. For some women, the cause of infertility is not known (unexplained infertility).  What increases the risk?  Age. A woman's fertility declines with age, especially after her mid-30s.  Being underweight or overweight.  Drinking too much alcohol.  Using drugs such as anabolic steroids, cocaine, and marijuana.  Exercising excessively.  Being exposed to environmental toxins, such as radiation, pesticides, and  certain chemicals.  What are the signs or symptoms?  The main sign of infertility in women is the inability to get pregnant or carry a pregnancy to full term.  How is this diagnosed?  This condition may be diagnosed by:  Checking whether you are ovulating each month. The tests may include:  Blood tests to check hormone levels.  An ultrasound of the ovaries.  Taking a small tissue that lines the uterus and checking it under a microscope (endometrial biopsy).  Doing additional tests. This is done if ovulation is normal. Tests may include:  Hysterosalpingography. This X-ray test can show the shape of the uterus and whether the fallopian tubes are open.  Laparoscopy. This test uses a lighted tube (laparoscope) to look for problems in the fallopian tubes and other organs.  Transvaginal ultrasound. This imaging test is used to check for abnormalities in the uterus and ovaries.  Hysteroscopy. This test uses a lighted tube to check for problems in the cervix and the uterus.  To be diagnosed with infertility, both partners will have a physical exam. Both partners will also have an extensive medical and sexual history taken. Additional tests may be done.  How is this treated?  Treatment depends on the cause of infertility. Most cases of infertility in women are treated with medicine or surgery.  Women may take medicine to:  Correct ovulation problems.  Treat other health conditions.  Surgery may be done to:  Repair damage to the ovaries, fallopian tubes, cervix, or uterus.  Remove growths from the uterus.  Remove scar tissue   from the uterus, pelvis, or other organs.  Assisted reproductive technology (ART)  Assisted reproductive technology (ART) refers to all treatments and procedures that combine eggs and sperm outside the body to try to help a couple conceive. ART is often combined with fertility drugs to stimulate ovulation. Sometimes ART is done using eggs retrieved from another woman's body (donor eggs) or from previously  frozen fertilized eggs (embryos).  There are different types of ART. These include:  Intrauterine insemination (IUI). A long, thin tube is used to place sperm directly into a woman's uterus. This procedure:  Is effective for infertility caused by sperm problems, including low sperm count and low motility.  Can be used in combination with fertility drugs.  In vitro fertilization (IVF). This is done when a woman's fallopian tubes are blocked or when a man has low sperm count. In this procedure:  Fertility drugs are used to stimulate the ovaries to produce multiple eggs.  Once mature, these eggs are removed from the body and combined with the sperm to be fertilized.  The fertilized eggs are then placed into the woman's uterus.  Follow these instructions at home:  Take over-the-counter and prescription medicines only as told by your health care provider.  Do not use any products that contain nicotine or tobacco, such as cigarettes and e-cigarettes. If you need help quitting, ask your health care provider.  If you drink alcohol, limit how much you have to 1 drink a day.  Make dietary changes to lose weight or maintain a healthy weight. Work with your health care provider and a dietitian to set a weight-loss goal that is healthy and reasonable for you.  Seek support from a counselor or support group to talk about your concerns related to infertility. Couples counseling may be helpful for you and your partner.  Practice stress reduction techniques that work well for you, such as regular physical activity, meditation, or deep breathing.  Keep all follow-up visits as told by your health care provider. This is important.  Contact a health care provider if you:  Feel that stress is interfering with your life and relationships.  Have side effects from treatments for infertility.  Summary  Female infertility refers to a woman's inability to get pregnant (conceive) after a year of having sex regularly (or after 6 months in women  over age 31) without using birth control.  To be diagnosed with infertility, both partners will have a physical exam. Both partners will also have an extensive medical and sexual history taken.  Seek support from a counselor or support group to talk about your concerns related to infertility. Couples counseling may be helpful for you and your partner.  This information is not intended to replace advice given to you by your health care provider. Make sure you discuss any questions you have with your health care provider.  Document Released: 04/04/2003 Document Revised: 03/03/2017 Document Reviewed: 03/03/2017  Elsevier Interactive Patient Education © 2019 Elsevier Inc.  •

## 2018-08-14 NOTE — Progress Notes (Signed)
GYN ENCOUNTER NOTE  Subjective:       Autumn Todd is a 33 y.o. G38P1001 female is here for gynecologic evaluation of the following issues:  1. Trying to conceive. Pt state she was on birth control (loloestrin) and stopped last February. She has actively been trying to conceive since June. She has monthly periods they are regular within a week or so.  Her period last for 5 days with the 1st day being the heavies. She experiences cramps that she take Midol for. She is using an app to track her periods. She has not used ovulation kit. She has one child 60 yrs old with same partner that she had no difficultly conceiving.  She denies smoking and Caffeine. She denies any recent change in weight.    Gynecologic History Patient's last menstrual period was 07/11/2018. Contraception: none Last Pap: 7/19/20018. Results were: normal Last mammogram: N/A .  Obstetric History OB History  Gravida Para Term Preterm AB Living  _0 SAB TAB Ectopic Multiple Live Births        0 1    # Outcome Date GA Lbr Len/2nd Weight Sex Delivery Anes PTL Lv  1 Term 04/27/15 [redacted]w[redacted]d 6 lb 0.7 oz (2.74 kg) F CS-LTranv Spinal, EPI  LIV    Past Medical History:  Diagnosis Date  . Allergy   . Anemia 07/24/2015  . Anxiety 03/11/2018  . Asthma   . Sinus tachycardia     Past Surgical History:  Procedure Laterality Date  . CESAREAN SECTION N/A 04/27/2015   Procedure: CESAREAN SECTION;  Surgeon: CAletha Halim MD;  Location: [facility] ORS;  Service: Obstetrics;  Laterality: N/A;  . WISDOM TOOTH EXTRACTION      Current Outpatient Medications on File Prior to Visit  Medication Sig Dispense Refill  . busPIRone (BUSPAR) 15 MG tablet Take 1 tablet (15 mg total) by mouth 3 (three) times daily. 90 tablet 2  . levocetirizine (XYZAL) 5 MG tablet Take 1 tablet (5 mg total) by mouth daily as needed for allergies. 30 tablet 6  . montelukast (SINGULAIR) 10 MG tablet TAKE 1 TABLET(10 MG) BY MOUTH AT BEDTIME FOR ALLERGIES 30  tablet 5  . ondansetron (ZOFRAN) 4 MG tablet Take 1 tablet (4 mg total) by mouth every 8 (eight) hours as needed for nausea or vomiting. (Patient not taking: Reported on 08/14/2018) 20 tablet 0   No current facility-administered medications on file prior to visit.     No Known Allergies  Social History   Socioeconomic History  . Marital status: Married    Spouse name: Not on file  . Number of children: 0  . Years of education: Not on file  . Highest education level: Not on file  Occupational History  . Occupation: TPharmacist, hospital   Comment: [employer]  Social Needs  . Financial resource strain: Not on file  . Food insecurity:    Worry: Not on file    Inability: Not on file  . Transportation needs:    Medical: Not on file    Non-medical: Not on file  Tobacco Use  . Smoking status: Never Smoker  . Smokeless tobacco: Never Used  Substance and Sexual Activity  . Alcohol use: No    Alcohol/week: 0.0 standard drinks  . Drug use: No  . Sexual activity: Yes    Partners: Male  Lifestyle  . Physical activity:    Days per week: Not on file    Minutes per  session: Not on file  . Stress: Not on file  Relationships  . Social connections:    Talks on phone: Not on file    Gets together: Not on file    Attends religious service: Not on file    Active member of club or organization: Not on file    Attends meetings of clubs or organizations: Not on file    Relationship status: Not on file  . Intimate partner violence:    Fear of current or ex partner: Not on file    Emotionally abused: Not on file    Physically abused: Not on file    Forced sexual activity: Not on file  Other Topics Concern  . Not on file  Social History Narrative  . Not on file    Family History  Problem Relation Age of Onset  . Cancer Maternal Grandfather     The following portions of the patient's history were reviewed and updated as appropriate: allergies, current medications, past family history, past medical  history, past social history, past surgical history and problem list.  Review of Systems Review of Systems - Negative except as mentioned in HPI Review of Systems - General ROS: negative for - chills, fatigue, fever, hot flashes, malaise or night sweats Hematological and Lymphatic ROS: negative for - bleeding problems or swollen lymph nodes Gastrointestinal ROS: negative for - abdominal pain, blood in stools, change in bowel habits and nausea/vomiting Musculoskeletal ROS: negative for - joint pain, muscle pain or muscular weakness Genito-Urinary ROS: negative for - change in menstrual cycle, dysmenorrhea, dyspareunia, dysuria, genital discharge, genital ulcers, hematuria, incontinence, irregular/heavy menses, nocturia or pelvic pain  Objective:   BP 107/62   Pulse (!) 115   Ht 5' (1.524 m)   Wt 148 lb (67.1 kg)   LMP 07/11/2018 Comment: irregular periods  BMI 28.90 kg/m  CONSTITUTIONAL: Well-developed, well-nourished female in no acute distress.  HENT:  Normocephalic, atraumatic.  NECK: Normal range of motion, supple, no masses.  Normal thyroid.  SKIN: Skin is warm and dry. No rash noted. Not diaphoretic. No erythema. No pallor. [place]: Alert and oriented to person, place, and time.  PSYCHIATRIC: Normal mood and affect. Normal behavior. Normal judgment and thought content. CARDIOVASCULAR:Not Examined RESPIRATORY: Not Examined BREASTS: Not Examined ABDOMEN: Soft, non distended; Non tender.  No Organomegaly. PELVIC:not indicated  MUSCULOSKELETAL: Normal range of motion. No tenderness.  No cyanosis, clubbing, or edema.     Assessment:   Infertility     Plan:   Discussed use of ovulation kit , basil body temperature and vaginal mucus change. . Recommend partner see urologist for semen analysis. Discussed weight loss to improve fertility. Discussed timing of intercourse.Intercourse 1-2 days during ovulation.  Remaining in bed x 30 min after. Labs and u/s ordered. Discussed use  of clomind in July if labs and u/s normal and not sucessful with ovulation kit. She verbalizes and agrees to plan of care. Follow up for labs.   I attest more than 50% of this visit spent reviewing history, discussing infertility. Discussing ovulation kit, timing of intercourse, lab & u/s testing, and use of medications. Face to face time 20 min.   Philip Aspen, CNM

## 2018-08-20 ENCOUNTER — Other Ambulatory Visit: Payer: Self-pay

## 2018-08-20 ENCOUNTER — Other Ambulatory Visit: Payer: BC Managed Care – PPO

## 2018-08-20 DIAGNOSIS — N926 Irregular menstruation, unspecified: Secondary | ICD-10-CM

## 2018-08-21 LAB — BETA HCG QUANT (REF LAB): hCG Quant: 4 m[IU]/mL

## 2018-08-24 ENCOUNTER — Other Ambulatory Visit: Payer: Self-pay

## 2018-08-24 ENCOUNTER — Other Ambulatory Visit: Payer: BC Managed Care – PPO

## 2018-08-24 DIAGNOSIS — Z319 Encounter for procreative management, unspecified: Secondary | ICD-10-CM

## 2018-08-25 LAB — BETA HCG QUANT (REF LAB): hCG Quant: <1 m[IU]/mL

## 2018-09-01 ENCOUNTER — Other Ambulatory Visit: Payer: Self-pay | Admitting: Family Medicine

## 2018-09-02 ENCOUNTER — Encounter: Payer: BC Managed Care – PPO | Admitting: Certified Nurse Midwife

## 2018-09-07 ENCOUNTER — Other Ambulatory Visit: Payer: Self-pay | Admitting: Family Medicine

## 2018-09-08 ENCOUNTER — Encounter: Payer: Self-pay | Admitting: Family Medicine

## 2018-09-08 DIAGNOSIS — F419 Anxiety disorder, unspecified: Secondary | ICD-10-CM

## 2018-09-08 MED ORDER — BUSPIRONE HCL 15 MG PO TABS: 15.0000 mg | ORAL_TABLET | Freq: Three times a day (TID) | ORAL | 0 refills | Status: DC

## 2018-09-11 ENCOUNTER — Ambulatory Visit: Payer: BC Managed Care – PPO | Admitting: Family Medicine

## 2018-09-22 ENCOUNTER — Other Ambulatory Visit: Payer: Self-pay | Admitting: Certified Nurse Midwife

## 2018-09-22 DIAGNOSIS — N979 Female infertility, unspecified: Secondary | ICD-10-CM

## 2018-09-28 ENCOUNTER — Other Ambulatory Visit: Payer: Self-pay | Admitting: Nurse Practitioner

## 2018-09-29 ENCOUNTER — Other Ambulatory Visit: Payer: Self-pay

## 2018-09-29 ENCOUNTER — Ambulatory Visit: Payer: BC Managed Care – PPO | Admitting: Nurse Practitioner

## 2018-09-29 ENCOUNTER — Encounter: Payer: Self-pay | Admitting: Nurse Practitioner

## 2018-09-29 ENCOUNTER — Ambulatory Visit: Payer: BC Managed Care – PPO | Admitting: Family Medicine

## 2018-09-29 VITALS — BP 108/64 | HR 95 | Temp 97.9°F | Resp 14 | Ht 60.0 in | Wt 146.3 lb

## 2018-09-29 DIAGNOSIS — Z9109 Other allergy status, other than to drugs and biological substances: Secondary | ICD-10-CM

## 2018-09-29 DIAGNOSIS — J4520 Mild intermittent asthma, uncomplicated: Secondary | ICD-10-CM | POA: Diagnosis not present

## 2018-09-29 DIAGNOSIS — F419 Anxiety disorder, unspecified: Secondary | ICD-10-CM | POA: Diagnosis not present

## 2018-09-29 MED ORDER — BUSPIRONE HCL 15 MG PO TABS: 15.0000 mg | ORAL_TABLET | Freq: Three times a day (TID) | ORAL | 3 refills | Status: DC

## 2018-09-29 NOTE — Progress Notes (Signed)
Name: Autumn Todd   MRN: [id number]    DOB: 18-Feb-1986   Date:09/29/2018       Progress Note  Subjective  Chief Complaint  Chief Complaint  Patient presents with  . Follow-up    HPI Allergies Takes xyzal 5mg  and singulair 10mg  daily- only symptoms is nasal congestion and states it is doing really good right now.   Anxiety She is taking 15mg  TID, states overall it works well but 2 days last week was having a lot of nausea and stress with then felt nausea related to anxiety and took zofran without relief. Since then it has resolved, last week it was the end of school and had a lot more stress then. She thinks her current dose overall works well for her and does not want to adjust at this time.   Patient denies cough, shortness of breath, wheezing, chest pain,  PHQ2/9: Depression screen [facility] 2/9 09/29/2018 03/11/2018 02/13/2018 02/13/2018 10/01/2017  Decreased Interest 0 0 0 0 0  Down, Depressed, Hopeless 0 0 1 1 0  PHQ - 2 Score 0 0 1 1 0  Altered sleeping 0 0 0 - -  Tired, decreased energy 0 0 1 - -  Change in appetite 0 0 1 - -  Feeling bad or failure about yourself  0 0 0 - -  Trouble concentrating 0 0 0 - -  Moving slowly or fidgety/restless 0 0 0 - -  Suicidal thoughts 0 0 0 - -  PHQ-9 Score 0 0 3 - -  Difficult doing work/chores Not difficult at all Not difficult at all Somewhat difficult - -     PHQ reviewed. Negative  Patient Active Problem List   Diagnosis Date Noted  . Anxiety 03/11/2018  . Aortic valve regurgitation 10/01/2017  . Mitral regurgitation 10/01/2017  . 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 Anemia 07/24/2015  . Blurred vision 07/24/2015  . De Quervain's tenosynovitis, left 06/06/2015  . Eczema of right hand 06/06/2015  . Cesarean delivery delivered 04/28/2015  . Asthma, mild persistent 10/11/2014    Past Medical History:  Diagnosis Date  . Allergy   . Anemia 07/24/2015  . Anxiety 03/11/2018  . Asthma    . Sinus tachycardia     Past Surgical History:  Procedure Laterality Date  . CESAREAN SECTION N/A 04/27/2015   Procedure: CESAREAN SECTION;  Surgeon: [place] Bingharlie Pickens, MD;  Location: [facility] ORS;  Service: Obstetrics;  Laterality: N/A;  . WISDOM TOOTH EXTRACTION      Social History   Tobacco Use  . Smoking status: Never Smoker  . Smokeless tobacco: Never Used  Substance Use Topics  . Alcohol use: No    Alcohol/week: 0.0 standard drinks     Current Outpatient Medications:  .  busPIRone (BUSPAR) 15 MG tablet, Take 1 tablet (15 mg total) by mouth 3 (three) times daily., Disp: 90 tablet, Rfl: 0 .  levocetirizine (XYZAL) 5 MG tablet, TAKE 1 TABLET(5 MG) BY MOUTH DAILY AS NEEDED FOR ALLERGIES, Disp: 30 tablet, Rfl: 6 .  montelukast (SINGULAIR) 10 MG tablet, TAKE 1 TABLET(10 MG) BY MOUTH AT BEDTIME FOR ALLERGIES, Disp: 30 tablet, Rfl: 0 .  ondansetron (ZOFRAN) 4 MG tablet, Take 1 tablet (4 mg total) by mouth every 8 (eight) hours as needed for nausea or vomiting., Disp: 20 tablet, Rfl: 0  No Known Allergies  ROS    No other specific complaints in a complete review of systems (except as listed in HPI above).  Objective  Vitals:   09/29/18 0905  BP: 108/64  Pulse: 95  Resp: 14  Temp: 97.9 F (36.6 C)  TempSrc: Oral  SpO2: 99%  Weight: 146 lb 4.8 oz (66.4 kg)  Height: 5' (1.524 m)     Body mass index is 28.57 kg/m.  Nursing Note and Vital Signs reviewed.  Physical Exam Constitutional:      Appearance: She is well-developed. She is not diaphoretic.  HENT:     Head: Normocephalic and atraumatic.  Cardiovascular:     Rate and Rhythm: Normal rate.  Pulmonary:     Effort: Pulmonary effort is normal.  Skin:    General: Skin is warm and dry.     Findings: No erythema.  Neurological:     Mental Status: She is alert and oriented to person, place, and time.     Coordination: Coordination normal.  Psychiatric:        Behavior: Behavior normal.        Thought Content:  Thought content normal.        Judgment: Judgment normal.        No results found for this or any previous visit (from the past 48 hour(s)).  Assessment & Plan  1. Mild intermittent asthma without complication stable  2. Anxiety stable - busPIRone (BUSPAR) 15 MG tablet; Take 1 tablet (15 mg total) by mouth 3 (three) times daily.  Dispense: 270 tablet; Refill: 3  3. Environmental allergies - Stop taking the (monteklast) Singulair 10mg  today and try being off of it for one week, if you do not notice a significant worsening of symptoms just stay off this medicine. If you are able to tolerate this well for 2 weeks then you can additionally stop they xyzal and just take xyzal as needed for nasal congestion, you can also use OTC fluticasone nasal spray as needed for nasal congestion.

## 2018-09-29 NOTE — Patient Instructions (Signed)
-   Stop taking the (monteklast) Singulair 10mg  today and try being off of it for one week, if you do not notice a significant worsening of symptoms just stay off this medicine. If you are able to tolerate this well for 2 weeks then you can additionally stop they xyzal and just take xyzal as needed for nasal congestion, you can also use OTC fluticasone nasal spray as needed for nasal congestion.

## 2018-10-07 ENCOUNTER — Telehealth: Payer: Self-pay

## 2018-10-07 NOTE — Telephone Encounter (Signed)
Coronavirus (COVID-19) Are you at risk?  Are you at risk for the Coronavirus (COVID-19)?  To be considered HIGH RISK for Coronavirus (COVID-19), you have to meet the following criteria:  . Traveled to [place], [place], [place], [place] or [place]; or in the [place] to [place], [place], [place], or [place]; and have fever, cough, and shortness of breath within the last 2 weeks of travel OR . Been in close contact with a person diagnosed with COVID-19 within the last 2 weeks and have fever, cough, and shortness of breath . IF YOU DO NOT MEET THESE CRITERIA, YOU ARE CONSIDERED LOW RISK FOR COVID-19.  What to do if you are HIGH RISK for COVID-19?  . If you are having a medical emergency, call 911. . Seek medical care right away. Before you go to a doctor's office, urgent care or emergency department, call ahead and tell them about your recent travel, contact with someone diagnosed with COVID-19, and your symptoms. You should receive instructions from your physician's office regarding next steps of care.  . When you arrive at healthcare provider, tell the healthcare staff immediately you have returned from visiting [place], [place], [place], [place] or [place]; or traveled in the [place] to [place], [place], [place], or [place]; in the last two weeks or you have been in close contact with a person diagnosed with COVID-19 in the last 2 weeks.   . Tell the health care staff about your symptoms: fever, cough and shortness of breath. . After you have been seen by a medical provider, you will be either: o Tested for (COVID-19) and discharged home on quarantine except to seek medical care if symptoms worsen, and asked to  - Stay home and avoid contact with others until you get your results (4-5 days)  - Avoid travel on public transportation if possible (such as bus, train, or airplane) or o Sent to the Emergency Department by EMS for evaluation, COVID-19 testing, and possible  admission depending on your condition and test results.  What to do if you are LOW RISK for COVID-19?  Reduce your risk of any infection by using the same precautions used for avoiding the common cold or flu:  . Wash your hands often with soap and warm water for at least 20 seconds.  If soap and water are not readily available, use an alcohol-based hand sanitizer with at least 60% alcohol.  . If coughing or sneezing, cover your mouth and nose by coughing or sneezing into the elbow areas of your shirt or coat, into a tissue or into your sleeve (not your hands). . Avoid shaking hands with others and consider head nods or verbal greetings only. . Avoid touching your eyes, nose, or mouth with unwashed hands.  . Avoid close contact with people who are sick. . Avoid places or events with large numbers of people in one location, like concerts or sporting events. . Carefully consider travel plans you have or are making. . If you are planning any travel outside or inside the [place], visit the CDC's Travelers' Health webpage for the latest health notices. . If you have some symptoms but not all symptoms, continue to monitor at home and seek medical attention if your symptoms worsen. . If you are having a medical emergency, call 911.   ADDITIONAL HEALTHCARE OPTIONS FOR PATIENTS  [facility] / e-Visit: [email or web address]         [facility]: [phone]  [facility]: [phone]                   [facility]: [phone]   Prescreened- neg. cm  

## 2018-10-08 ENCOUNTER — Ambulatory Visit (INDEPENDENT_AMBULATORY_CARE_PROVIDER_SITE_OTHER): Payer: BC Managed Care – PPO

## 2018-10-08 ENCOUNTER — Other Ambulatory Visit: Payer: BC Managed Care – PPO

## 2018-10-08 ENCOUNTER — Other Ambulatory Visit: Payer: Self-pay

## 2018-10-08 DIAGNOSIS — Z319 Encounter for procreative management, unspecified: Secondary | ICD-10-CM

## 2018-10-08 DIAGNOSIS — N979 Female infertility, unspecified: Secondary | ICD-10-CM

## 2018-10-09 LAB — THYROID PANEL WITH TSH
Free Thyroxine Index: 1.6 (ref 1.2–4.9)
T3 Uptake Ratio: 21 % — ABNORMAL LOW (ref 24–39)
T4, Total: 7.5 ug/dL (ref 4.5–12.0)
TSH: 1.57 u[IU]/mL (ref 0.450–4.500)

## 2018-10-09 LAB — PROGESTERONE: Progesterone: 9.7 ng/mL

## 2018-10-09 LAB — ESTRADIOL: Estradiol: 83.8 pg/mL

## 2018-10-27 ENCOUNTER — Other Ambulatory Visit: Payer: Self-pay | Admitting: Nurse Practitioner

## 2018-11-02 ENCOUNTER — Telehealth: Payer: Self-pay

## 2018-11-02 NOTE — Telephone Encounter (Signed)
Coronavirus (COVID-19) Are you at risk?  Are you at risk for the Coronavirus (COVID-19)?  To be considered HIGH RISK for Coronavirus (COVID-19), you have to meet the following criteria:  . Traveled to [place], [place], [place], [place] or [place]; or in the [place] to [place], [place], [place], or [place]; and have fever, cough, and shortness of breath within the last 2 weeks of travel OR . Been in close contact with a person diagnosed with COVID-19 within the last 2 weeks and have fever, cough, and shortness of breath . IF YOU DO NOT MEET THESE CRITERIA, YOU ARE CONSIDERED LOW RISK FOR COVID-19.  What to do if you are HIGH RISK for COVID-19?  . If you are having a medical emergency, call 911. . Seek medical care right away. Before you go to a doctor's office, urgent care or emergency department, call ahead and tell them about your recent travel, contact with someone diagnosed with COVID-19, and your symptoms. You should receive instructions from your physician's office regarding next steps of care.  . When you arrive at healthcare provider, tell the healthcare staff immediately you have returned from visiting [place], [place], [place], [place] or [place]; or traveled in the [place] to [place], [place], [place], or [place]; in the last two weeks or you have been in close contact with a person diagnosed with COVID-19 in the last 2 weeks.   . Tell the health care staff about your symptoms: fever, cough and shortness of breath. . After you have been seen by a medical provider, you will be either: o Tested for (COVID-19) and discharged home on quarantine except to seek medical care if symptoms worsen, and asked to  - Stay home and avoid contact with others until you get your results (4-5 days)  - Avoid travel on public transportation if possible (such as bus, train, or airplane) or o Sent to the Emergency Department by EMS for evaluation, COVID-19 testing, and possible  admission depending on your condition and test results.  What to do if you are LOW RISK for COVID-19?  Reduce your risk of any infection by using the same precautions used for avoiding the common cold or flu:  . Wash your hands often with soap and warm water for at least 20 seconds.  If soap and water are not readily available, use an alcohol-based hand sanitizer with at least 60% alcohol.  . If coughing or sneezing, cover your mouth and nose by coughing or sneezing into the elbow areas of your shirt or coat, into a tissue or into your sleeve (not your hands). . Avoid shaking hands with others and consider head nods or verbal greetings only. . Avoid touching your eyes, nose, or mouth with unwashed hands.  . Avoid close contact with people who are Earlisha Sharples. . Avoid places or events with large numbers of people in one location, like concerts or sporting events. . Carefully consider travel plans you have or are making. . If you are planning any travel outside or inside the [place], visit the CDC's Travelers' Health webpage for the latest health notices. . If you have some symptoms but not all symptoms, continue to monitor at home and seek medical attention if your symptoms worsen. . If you are having a medical emergency, call 911.  11/02/18 SCREENING NEG [facility] ADDITIONAL HEALTHCARE OPTIONS FOR PATIENTS  [facility] / e-Visit: [email or web address]         [facility]: [phone]    [facility]: [phone]                   [facility]: [phone]  

## 2018-11-03 ENCOUNTER — Encounter: Payer: Self-pay | Admitting: Family Medicine

## 2018-11-03 ENCOUNTER — Ambulatory Visit: Payer: BC Managed Care – PPO | Admitting: Certified Nurse Midwife

## 2018-11-03 ENCOUNTER — Encounter: Payer: Self-pay | Admitting: Certified Nurse Midwife

## 2018-11-03 ENCOUNTER — Other Ambulatory Visit: Payer: Self-pay

## 2018-11-03 VITALS — BP 119/64 | HR 102 | Ht 60.0 in | Wt 150.6 lb

## 2018-11-03 DIAGNOSIS — N912 Amenorrhea, unspecified: Secondary | ICD-10-CM

## 2018-11-03 DIAGNOSIS — Z3201 Encounter for pregnancy test, result positive: Secondary | ICD-10-CM | POA: Diagnosis not present

## 2018-11-03 LAB — POCT URINE PREGNANCY: Preg Test, Ur: POSITIVE — AB

## 2018-11-03 NOTE — Progress Notes (Signed)
Subjective:    Autumn Todd is a 33 y.o. female who presents for evaluation of amenorrhea. She believes she could be pregnant. Pregnancy is desired. Sexual Activity: single partner, contraception: none. Current symptoms also include: fatigue. Last period was normal.   No LMP recorded. The following portions of the patient's history were reviewed and updated as appropriate: allergies, current medications, past family history, past medical history, past social history, past surgical history and problem list.  Review of Systems Pertinent items are noted in HPI.     Objective:    There were no vitals taken for this visit. General: alert, cooperative, appears stated age and no acute distress    Lab Review Urine HCG: positive    Assessment:    Absence of menstruation.     Plan:   Positive: EDC: [facility]. Briefly discussed pre-natal care options. Pregnancy, Childbirth and MD or midwifery care. Pt request to see midwives. Encouraged well-balanced diet, plenty of rest when needed, pre-natal vitamins daily and walking for exercise. Discussed self-help for nausea, avoiding OTC medications until consulting provider or pharmacist, other than Tylenol as needed, minimal caffeine (1-2 cups daily) and avoiding alcohol. She currently take buspar for anxiety. Discussed changing to SSRI to help control anxiety.  She will schedule her dating u/s next week, her nurse visit for 10 wks , initial OB visit @ 12 wks.  Feel free to call with any questions.   Philip Aspen, CNM

## 2018-11-03 NOTE — Patient Instructions (Signed)

## 2018-11-04 NOTE — Telephone Encounter (Unsigned)
Copied from [facility] [phone]. Topic: General - Call Back - No Documentation >> Nov 04, 2018 11:55 AM [name] wrote: Reason for CRM:  "Hi!  I just found out I was pregnant last week, and went to my OB today.  She is highly recommending that I switch to a S.S.R.I anxiety medication from what I am taking as soon as possible.  Is there anyway I could try something out that is similar to what I have now?  I'm not sure if I would need to come in to do this, or if you could just recommend something for me to try and send it to be filled.  Thank you! Autumn Todd"

## 2018-11-09 ENCOUNTER — Encounter: Payer: Self-pay | Admitting: Family Medicine

## 2018-11-09 ENCOUNTER — Other Ambulatory Visit: Payer: Self-pay

## 2018-11-09 ENCOUNTER — Ambulatory Visit: Payer: BC Managed Care – PPO | Admitting: Family Medicine

## 2018-11-09 VITALS — BP 118/62 | HR 107 | Temp 97.0°F | Resp 14 | Ht 60.0 in | Wt 153.5 lb

## 2018-11-09 DIAGNOSIS — Z9109 Other allergy status, other than to drugs and biological substances: Secondary | ICD-10-CM | POA: Diagnosis not present

## 2018-11-09 DIAGNOSIS — F419 Anxiety disorder, unspecified: Secondary | ICD-10-CM

## 2018-11-09 MED ORDER — BUSPIRONE HCL 10 MG PO TABS: 10.0000 mg | ORAL_TABLET | Freq: Two times a day (BID) | ORAL | 1 refills | Status: DC

## 2018-11-09 NOTE — Progress Notes (Signed)
Name: Autumn Todd   MRN: [id number]    DOB: [birth year]   Date:11/09/2018       Progress Note  Subjective  Chief Complaint  Chief Complaint  Patient presents with  . Medication Problem    HPI  Pt presents to discuss her anxiety medication in relation to her new pregnancy.  She is about [redacted] weeks along today.  She has been taking buspar 2 months ago and has been doing very well on this medication. Buspar is considered old category B, and is noted to be able to be used during pregnancy. She is currently on 15mg  TID of buspar. We did discuss zoloft and SSRI therapies in detail.  Reviewed drug data on both Zoloft and Buspar and reviewed risk/benefit profile of each medication.  We will decrease buspar dosing to 10mg  BID and see how she does on this. Wants to hold off on switching to zoloft for now.   Allergies: She stopped monteleukast and is doing well on just Xyzal.  Advised may drop down to Xyzal PRN only. She may use OTC flonase PRN.   Patient Active Problem List   Diagnosis Date Noted  . Environmental allergies 09/29/2018  . Anxiety 03/11/2018  . Aortic valve regurgitation 10/01/2017  . Mitral regurgitation 10/01/2017  . 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 De Quervain's tenosynovitis, left 06/06/2015  . Eczema of right hand 06/06/2015  . Asthma, mild intermittent 10/11/2014    Social History   Tobacco Use  . Smoking status: Never Smoker  . Smokeless tobacco: Never Used  Substance Use Topics  . Alcohol use: No    Alcohol/week: 0.0 standard drinks     Current Outpatient Medications:  .  busPIRone (BUSPAR) 15 MG tablet, Take 1 tablet (15 mg total) by mouth 3 (three) times daily., Disp: 270 tablet, Rfl: 3 .  levocetirizine (XYZAL) 5 MG tablet, TAKE 1 TABLET(5 MG) BY MOUTH DAILY AS NEEDED FOR ALLERGIES, Disp: 30 tablet, Rfl: 6 .  Prenatal Vit-Fe Fumarate-FA (PRENATAL MULTIVITAMIN) TABS tablet, Take 1 tablet by mouth daily at 12  noon., Disp: , Rfl:  .  montelukast (SINGULAIR) 10 MG tablet, TAKE 1 TABLET(10 MG) BY MOUTH AT BEDTIME FOR ALLERGIES, Disp: 90 tablet, Rfl: 0 .  ondansetron (ZOFRAN) 4 MG tablet, Take 1 tablet (4 mg total) by mouth every 8 (eight) hours as needed for nausea or vomiting., Disp: 20 tablet, Rfl: 0  No Known Allergies  I personally reviewed active problem list, medication list, allergies, notes from last encounter, lab results with the patient/caregiver today.  ROS  Constitutional: Negative for fever or weight change.  Respiratory: Negative for cough and shortness of breath.   Cardiovascular: Negative for chest pain or palpitations.  Gastrointestinal: Negative for abdominal pain, no bowel changes.  Musculoskeletal: Negative for gait problem or joint swelling.  Skin: Negative for rash.  Neurological: Negative for dizziness or headache.  No other specific complaints in a complete review of systems (except as listed in HPI above).  Objective  Vitals:   11/09/18 0908  BP: 118/62  Pulse: (!) 107  Resp: 14  Temp: (!) 97 F (36.1 C)  SpO2: 96%  Weight: 153 lb 8 oz (69.6 kg)  Height: 5' (1.524 m)   Body mass index is 29.98 kg/m.  Nursing Note and Vital Signs reviewed.  Physical Exam  Constitutional: Patient appears well-developed and well-nourished. No distress.  HENT: Head: Normocephalic and atraumatic.  Eyes: Conjunctivae and EOM are normal. No scleral icterus. Neck:  Normal range of motion. Neck supple. Cardiovascular: Normal rate, regular rhythm and normal heart sounds.  No murmur heard. No BLE edema. Pulmonary/Chest: Effort normal and breath sounds normal. No respiratory distress. Musculoskeletal: Normal range of motion, no joint effusions. No gross deformities Neurological: Pt is alert and oriented to person, place, and time. No cranial nerve deficit. Coordination, balance, strength, speech and gait are normal.  Skin: Skin is warm and dry. No rash noted. No erythema.   Psychiatric: Patient has a normal mood and affect. behavior is normal. Judgment and thought content normal.  No results found for this or any previous visit (from the past 72 hour(s)).  Assessment & Plan  1. Anxiety - We will wean down with goal to come off of medication and switch onto Zoloft if needed. She would like to come off of the medication completely if able to tolerate. Is open to Zoloft if needed.  Significant discussion regarding pros/cons, risks/benefits of each medication was had with the patient and collaborative decision making was utilized. - busPIRone (BUSPAR) 10 MG tablet; Take 1 tablet (10 mg total) by mouth 2 (two) times daily.  Dispense: 180 tablet; Refill: 1 -Red flags and when to present for emergency care or RTC including fever >101.37F, chest pain, shortness of breath, new/worsening/un-resolving symptoms, reviewed with patient at time of visit. Follow up and care instructions discussed and provided in AVS.

## 2018-11-11 ENCOUNTER — Other Ambulatory Visit: Payer: Self-pay

## 2018-11-11 MED ORDER — DOXYLAMINE-PYRIDOXINE 10-10 MG PO TBEC: 1.0000 | DELAYED_RELEASE_TABLET | Freq: Two times a day (BID) | ORAL | 6 refills | Status: DC

## 2018-11-12 ENCOUNTER — Other Ambulatory Visit: Payer: Self-pay

## 2018-11-12 ENCOUNTER — Ambulatory Visit (INDEPENDENT_AMBULATORY_CARE_PROVIDER_SITE_OTHER): Payer: BC Managed Care – PPO

## 2018-11-12 DIAGNOSIS — N912 Amenorrhea, unspecified: Secondary | ICD-10-CM

## 2018-11-12 DIAGNOSIS — O3481 Maternal care for other abnormalities of pelvic organs, first trimester: Secondary | ICD-10-CM | POA: Diagnosis not present

## 2018-11-12 DIAGNOSIS — N8312 Corpus luteum cyst of left ovary: Secondary | ICD-10-CM

## 2018-11-12 DIAGNOSIS — Z3A08 8 weeks gestation of pregnancy: Secondary | ICD-10-CM | POA: Diagnosis not present

## 2018-11-23 ENCOUNTER — Ambulatory Visit (INDEPENDENT_AMBULATORY_CARE_PROVIDER_SITE_OTHER): Payer: BC Managed Care – PPO | Admitting: Certified Nurse Midwife

## 2018-11-23 ENCOUNTER — Other Ambulatory Visit: Payer: Self-pay

## 2018-11-23 VITALS — BP 101/63 | HR 96 | Ht 60.0 in | Wt 154.2 lb

## 2018-11-23 DIAGNOSIS — Z3490 Encounter for supervision of normal pregnancy, unspecified, unspecified trimester: Secondary | ICD-10-CM

## 2018-11-23 DIAGNOSIS — Z0283 Encounter for blood-alcohol and blood-drug test: Secondary | ICD-10-CM

## 2018-11-23 DIAGNOSIS — Z113 Encounter for screening for infections with a predominantly sexual mode of transmission: Secondary | ICD-10-CM

## 2018-11-23 DIAGNOSIS — R638 Other symptoms and signs concerning food and fluid intake: Secondary | ICD-10-CM

## 2018-11-23 LAB — OB RESULTS CONSOLE VARICELLA ZOSTER ANTIBODY, IGG: Varicella: NON-IMMUNE/NOT IMMUNE

## 2018-11-23 NOTE — Progress Notes (Signed)
      Autumn Todd presents for NOB nurse intake visit. Pregnancy confirmation done at [facility], 11/03/18, with Philip Aspen. G3.  P1001.  LMP 09/19/18.  EDD 06/22/2019.  [place] [redacted]w[redacted]d. Pregnancy education material explained and given. 0 cats in the home.  NOB labs ordered. BMI greater than 30. TSH/HbgA1c ordered. HIV and drug screen explained and ordered. Genetic screening discussed. Genetic testing;Unsure. Pt to discuss genetic testing with provider. PNV encouraged. Pt to follow up with provider in 2 weeks for NOB physical. [place] and FMLA forms completed and signed by pt.   BP 101/63   Pulse 96   Ht 5' (1.524 m)   Wt 154 lb 3.2 oz (69.9 kg)   LMP 09/19/2018 (Exact Date)   Breastfeeding Unknown   BMI 30.12 kg/m

## 2018-11-23 NOTE — Patient Instructions (Signed)
WHAT OB PATIENTS CAN EXPECT   Confirmation of pregnancy and ultrasound ordered if medically indicated-[redacted] weeks gestation  New OB (NOB) intake with nurse and New OB (NOB) labs- [redacted] weeks gestation  New OB (NOB) physical examination with provider- 11/[redacted] weeks gestation  Flu vaccine-[redacted] weeks gestation  Anatomy scan-[redacted] weeks gestation  Glucose tolerance test, blood work to test for anemia, T-dap vaccine-[redacted] weeks gestation  Vaginal swabs/cultures-STD/Group B strep-[redacted] weeks gestation  Appointments every 4 weeks until 28 weeks  Every 2 weeks from 28 weeks until 36 weeks  Weekly visits from 36 weeks until delivery  Prenatal Care Prenatal care is health care during pregnancy. It helps you and your unborn baby (fetus) stay as healthy as possible. Prenatal care may be provided by a midwife, a family practice health care provider, or a childbirth and pregnancy specialist (obstetrician). How does this affect me? During pregnancy, you will be closely monitored for any new conditions that might develop. To lower your risk of pregnancy complications, you and your health care provider will talk about any underlying conditions you have. How does this affect my baby? Early and consistent prenatal care increases the chance that your baby will be healthy during pregnancy. Prenatal care lowers the risk that your baby will be:  Born early (prematurely).  Smaller than expected at birth (small for gestational age). What can I expect at the first prenatal care visit? Your first prenatal care visit will likely be the longest. You should schedule your first prenatal care visit as soon as you know that you are pregnant. Your first visit is a good time to talk about any questions or concerns you have about pregnancy. At your visit, you and your health care provider will talk about:  Your medical history, including: ? Any past pregnancies. ? Your family's medical history. ? The baby's father's medical history.  ? Any long-term (chronic) health conditions you have and how you manage them. ? Any surgeries or procedures you have had. ? Any current over-the-counter or prescription medicines, herbs, or supplements you are taking.  Other factors that could pose a risk to your baby, including:  Your home setting and your stress levels, including: ? Exposure to abuse or violence. ? Household financial strain. ? Mental health conditions you have.  Your daily health habits, including diet and exercise. Your health care provider will also:  Measure your weight, height, and blood pressure.  Do a physical exam, including a pelvic and breast exam.  Perform blood tests and urine tests to check for: ? Urinary tract infection. ? Sexually transmitted infections (STIs). ? Low iron levels in your blood (anemia). ? Blood type and certain proteins on red blood cells (Rh antibodies). ? Infections and immunity to viruses, such as hepatitis B and rubella. ? HIV (human immunodeficiency virus).  Do an ultrasound to confirm your baby's growth and development and to help predict your estimated due date (EDD). This ultrasound is done with a probe that is inserted into the vagina (transvaginal ultrasound).  Discuss your options for genetic screening.  Give you information about how to keep yourself and your baby healthy, including: ? Nutrition and taking vitamins. ? Physical activity. ? How to manage pregnancy symptoms such as nausea and vomiting (morning sickness). ? Infections and substances that may be harmful to your baby and how to avoid them. ? Food safety. ? Dental care. ? Working. ? Travel. ? Warning signs to watch for and when to call your health care provider. How often will  I have prenatal care visits? After your first prenatal care visit, you will have regular visits throughout your pregnancy. The visit schedule is often as follows:  Up to week 28 of pregnancy: once every 4 weeks.  28-36 weeks:  once every 2 weeks.  After 36 weeks: every week until delivery. Some women may have visits more or less often depending on any underlying health conditions and the health of the baby. Keep all follow-up and prenatal care visits as told by your health care provider. This is important. What happens during routine prenatal care visits? Your health care provider will:  Measure your weight and blood pressure.  Check for fetal heart sounds.  Measure the height of your uterus in your abdomen (fundal height). This may be measured starting around week 20 of pregnancy.  Check the position of your baby inside your uterus.  Ask questions about your diet, sleeping patterns, and whether you can feel the baby move.  Review warning signs to watch for and signs of labor.  Ask about any pregnancy symptoms you are having and how you are dealing with them. Symptoms may include: ? Headaches. ? Nausea and vomiting. ? Vaginal discharge. ? Swelling. ? Fatigue. ? Constipation. ? Any discomfort, including back or pelvic pain. Make a list of questions to ask your health care provider at your routine visits. What tests might I have during prenatal care visits? You may have blood, urine, and imaging tests throughout your pregnancy, such as:  Urine tests to check for glucose, protein, or signs of infection.  Glucose tests to check for a form of diabetes that can develop during pregnancy (gestational diabetes mellitus). This is usually done around week 24 of pregnancy.  An ultrasound to check your baby's growth and development and to check for birth defects. This is usually done around week 20 of pregnancy.  A test to check for group B strep (GBS) infection. This is usually done around week 36 of pregnancy.  Genetic testing. This may include blood or imaging tests, such as an ultrasound. Some genetic tests are done during the first trimester and some are done during the second trimester. What else can I  expect during prenatal care visits? Your health care provider may recommend getting certain vaccines during pregnancy. These may include:  A yearly flu shot (annual influenza vaccine). This is especially important if you will be pregnant during flu season.  Tdap (tetanus, diphtheria, pertussis) vaccine. Getting this vaccine during pregnancy can protect your baby from whooping cough (pertussis) after birth. This vaccine may be recommended between weeks 27 and 36 of pregnancy. Later in your pregnancy, your health care provider may give you information about:  Childbirth and breastfeeding classes.  Choosing a health care provider for your baby.  Umbilical cord banking.  Breastfeeding.  Birth control after your baby is born.  The hospital labor and delivery unit and how to tour it.  Registering at the hospital before you go into labor. Where to find more information  Office on Women's Health: LegalWarrants.gl  American Pregnancy Association: americanpregnancy.org  March of Dimes: marchofdimes.org Summary  Prenatal care helps you and your baby stay as healthy as possible during pregnancy.  Your first prenatal care visit will most likely be the longest.  You will have visits and tests throughout your pregnancy to monitor your health and your baby's health.  Bring a list of questions to your visits to ask your health care provider.  Make sure to keep all follow-up and prenatal  care visits with your health care provider. This information is not intended to replace advice given to you by your health care provider. Make sure you discuss any questions you have with your health care provider. Document Released: 04/04/2003 Document Revised: 07/22/2018 Document Reviewed: 03/31/2017 Elsevier Patient Education  2020 [employer]. How a Baby Grows During Pregnancy  Pregnancy begins when a female's sperm enters a female's egg (fertilization). Fertilization usually happens in one of the tubes  (fallopian tubes) that connect the ovaries to the womb (uterus). The fertilized egg moves down the fallopian tube to the uterus. Once it reaches the uterus, it implants into the lining of the uterus and begins to grow. For the first 10 weeks, the fertilized egg is called an embryo. After 10 weeks, it is called a fetus. As the fetus continues to grow, it receives oxygen and nutrients through tissue (placenta) that grows to support the developing baby. The placenta is the life support system for the baby. It provides oxygen and nutrition and removes waste. Learning as much as you can about your pregnancy and how your baby is developing can help you enjoy the experience. It can also make you aware of when there might be a problem and when to ask questions. How long does a typical pregnancy last? A pregnancy usually lasts 280 days, or about 40 weeks. Pregnancy is divided into three periods of growth, also called trimesters:  First trimester: 0-12 weeks.  Second trimester: 13-27 weeks.  Third trimester: 28-40 weeks. The day when your baby is ready to be born (full term) is your estimated date of delivery. How does my baby develop month by month? First month  The fertilized egg attaches to the inside of the uterus.  Some cells will form the placenta. Others will form the fetus.  The arms, legs, brain, spinal cord, lungs, and heart begin to develop.  At the end of the first month, the heart begins to beat. Second month  The bones, inner ear, eyelids, hands, and feet form.  The genitals develop.  By the end of 8 weeks, all major organs are developing. Third month  All of the internal organs are forming.  Teeth develop below the gums.  Bones and muscles begin to grow. The spine can flex.  The skin is transparent.  Fingernails and toenails begin to form.  Arms and legs continue to grow longer, and hands and feet develop.  The fetus is about 3 inches (7.6 cm) long. Fourth month   The placenta is completely formed.  The external sex organs, neck, outer ear, eyebrows, eyelids, and fingernails are formed.  The fetus can hear, swallow, and move its arms and legs.  The kidneys begin to produce urine.  The skin is covered with a white, waxy coating (vernix) and very fine hair (lanugo). Fifth month  The fetus moves around more and can be felt for the first time (quickening).  The fetus starts to sleep and wake up and may begin to suck its finger.  The nails grow to the end of the fingers.  The organ in the digestive system that makes bile (gallbladder) functions and helps to digest nutrients.  If your baby is a girl, eggs are present in her ovaries. If your baby is a boy, testicles start to move down into his scrotum. Sixth month  The lungs are formed.  The eyes open. The brain continues to develop.  Your baby has fingerprints and toe prints. Your baby's hair grows thicker.  At the end of the second trimester, the fetus is about 9 inches (22.9 cm) long. Seventh month  The fetus kicks and stretches.  The eyes are developed enough to sense changes in light.  The hands can make a grasping motion.  The fetus responds to sound. Eighth month  All organs and body systems are fully developed and functioning.  Bones harden, and taste buds develop. The fetus may hiccup.  Certain areas of the brain are still developing. The skull remains soft. Ninth month  The fetus gains about  lb (0.23 kg) each week.  The lungs are fully developed.  Patterns of sleep develop.  The fetus's head typically moves into a head-down position (vertex) in the uterus to prepare for birth.  The fetus weighs 6-9 lb (2.72-4.08 kg) and is 19-20 inches (48.26-50.8 cm) long. What can I do to have a healthy pregnancy and help my baby develop? General instructions  Take prenatal vitamins as directed by your health care provider. These include vitamins such as folic acid, iron,  calcium, and vitamin D. They are important for healthy development.  Take medicines only as directed by your health care provider. Read labels and ask a pharmacist or your health care provider whether over-the-counter medicines, supplements, and prescription drugs are safe to take during pregnancy.  Keep all follow-up visits as directed by your health care provider. This is important. Follow-up visits include prenatal care and screening tests. How do I know if my baby is developing well? At each prenatal visit, your health care provider will do several different tests to check on your health and keep track of your baby's development. These include:  Fundal height and position. ? Your health care provider will measure your growing belly from your pubic bone to the top of the uterus using a tape measure. ? Your health care provider will also feel your belly to determine your baby's position.  Heartbeat. ? An ultrasound in the first trimester can confirm pregnancy and show a heartbeat, depending on how far along you are. ? Your health care provider will check your baby's heart rate at every prenatal visit.  Second trimester ultrasound. ? This ultrasound checks your baby's development. It also may show your baby's gender. What should I do if I have concerns about my baby's development? Always talk with your health care provider about any concerns that you may have about your pregnancy and your baby. Summary  A pregnancy usually lasts 280 days, or about 40 weeks. Pregnancy is divided into three periods of growth, also called trimesters.  Your health care provider will monitor your baby's growth and development throughout your pregnancy.  Follow your health care provider's recommendations about taking prenatal vitamins and medicines during your pregnancy.  Talk with your health care provider if you have any concerns about your pregnancy or your developing baby. This information is not intended  to replace advice given to you by your health care provider. Make sure you discuss any questions you have with your health care provider. Document Released: 09/18/2007 Document Revised: 07/23/2018 Document Reviewed: 02/12/2017 Elsevier Patient Education  2020 [place] of Pregnancy  The first trimester of pregnancy is from week 1 until the end of week 13 (months 1 through 3). During this time, your baby will begin to develop inside you. At 6-8 weeks, the eyes and face are formed, and the heartbeat can be seen on ultrasound. At the end of 12 weeks, all the baby's organs are formed. Prenatal  care is all the medical care you receive before the birth of your baby. Make sure you get good prenatal care and follow all of your doctor's instructions. Follow these instructions at home: Medicines  Take over-the-counter and prescription medicines only as told by your doctor. Some medicines are safe and some medicines are not safe during pregnancy.  Take a prenatal vitamin that contains at least 600 micrograms (mcg) of folic acid.  If you have trouble pooping (constipation), take medicine that will make your stool soft (stool softener) if your doctor approves. Eating and drinking   Eat regular, healthy meals.  Your doctor will tell you the amount of weight gain that is right for you.  Avoid raw meat and uncooked cheese.  If you feel sick to your stomach (nauseous) or throw up (vomit): ? Eat 4 or 5 small meals a day instead of 3 large meals. ? Try eating a few soda crackers. ? Drink liquids between meals instead of during meals.  To prevent constipation: ? Eat foods that are high in fiber, like fresh fruits and vegetables, whole grains, and beans. ? Drink enough fluids to keep your pee (urine) clear or pale yellow. Activity  Exercise only as told by your doctor. Stop exercising if you have cramps or pain in your lower belly (abdomen) or low back.  Do not exercise if it is  too hot, too humid, or if you are in a place of great height (high altitude).  Try to avoid standing for long periods of time. Move your legs often if you must stand in one place for a long time.  Avoid heavy lifting.  Wear low-heeled shoes. Sit and stand up straight.  You can have sex unless your doctor tells you not to. Relieving pain and discomfort  Wear a good support bra if your breasts are sore.  Take warm water baths (sitz baths) to soothe pain or discomfort caused by hemorrhoids. Use hemorrhoid cream if your doctor says it is okay.  Rest with your legs raised if you have leg cramps or low back pain.  If you have puffy, bulging veins (varicose veins) in your legs: ? Wear support hose or compression stockings as told by your doctor. ? Raise (elevate) your feet for 15 minutes, 3-4 times a day. ? Limit salt in your food. Prenatal care  Schedule your prenatal visits by the twelfth week of pregnancy.  Write down your questions. Take them to your prenatal visits.  Keep all your prenatal visits as told by your doctor. This is important. Safety  Wear your seat belt at all times when driving.  Make a list of emergency phone numbers. The list should include numbers for family, friends, the hospital, and police and fire departments. General instructions  Ask your doctor for a referral to a local prenatal class. Begin classes no later than at the start of month 6 of your pregnancy.  Ask for help if you need counseling or if you need help with nutrition. Your doctor can give you advice or tell you where to go for help.  Do not use hot tubs, steam rooms, or saunas.  Do not douche or use tampons or scented sanitary pads.  Do not cross your legs for long periods of time.  Avoid all herbs and alcohol. Avoid drugs that are not approved by your doctor.  Do not use any tobacco products, including cigarettes, chewing tobacco, and electronic cigarettes. If you need help quitting, ask  your doctor. You may  get counseling or other support to help you quit.  Avoid cat litter boxes and soil used by cats. These carry germs that can cause birth defects in the baby and can cause a loss of your baby (miscarriage) or stillbirth.  Visit your dentist. At home, brush your teeth with a soft toothbrush. Be gentle when you floss. Contact a doctor if:  You are dizzy.  You have mild cramps or pressure in your lower belly.  You have a nagging pain in your belly area.  You continue to feel sick to your stomach, you throw up, or you have watery poop (diarrhea).  You have a bad smelling fluid coming from your vagina.  You have pain when you pee (urinate).  You have increased puffiness (swelling) in your face, hands, legs, or ankles. Get help right away if:  You have a fever.  You are leaking fluid from your vagina.  You have spotting or bleeding from your vagina.  You have very bad belly cramping or pain.  You gain or lose weight rapidly.  You throw up blood. It may look like coffee grounds.  You are around people who have [place] measles, fifth disease, or chickenpox.  You have a very bad headache.  You have shortness of breath.  You have any kind of trauma, such as from a fall or a car accident. Summary  The first trimester of pregnancy is from week 1 until the end of week 13 (months 1 through 3).  To take care of yourself and your unborn baby, you will need to eat healthy meals, take medicines only if your doctor tells you to do so, and do activities that are safe for you and your baby.  Keep all follow-up visits as told by your doctor. This is important as your doctor will have to ensure that your baby is healthy and growing well. This information is not intended to replace advice given to you by your health care provider. Make sure you discuss any questions you have with your health care provider. Document Released: 09/18/2007 Document Revised: 07/23/2018 Document  Reviewed: 04/09/2016 Elsevier Patient Education  2020 [employer]. Commonly Asked Questions During Pregnancy  Cats: A parasite can be excreted in cat feces.  To avoid exposure you need to have another person empty the little box.  If you must empty the litter box you will need to wear gloves.  Wash your hands after handling your cat.  This parasite can also be found in raw or undercooked meat so this should also be avoided.  Colds, Sore Throats, Flu: Please check your medication sheet to see what you can take for symptoms.  If your symptoms are unrelieved by these medications please call the office.  Dental Work: Most any dental work [employer] banker[employer] recommends is permitted.  X-rays should only be taken during the first trimester if absolutely necessary.  Your abdomen should be shielded with a lead apron during all x-rays.  Please notify your provider prior to receiving any x-rays.  Novocaine is fine; gas is not recommended.  If your dentist requires a note from [place] prior to dental work please call the office and we will provide one for you.  Exercise: Exercise is an important part of staying healthy during your pregnancy.  You may continue most exercises you were accustomed to prior to pregnancy.  Later in your pregnancy you will most likely notice you have difficulty with activities requiring balance like riding a bicycle.  It is important that  you listen to your body and avoid activities that put you at a higher risk of falling.  Adequate rest and staying well hydrated are a must!  If you have questions about the safety of specific activities ask your provider.    Exposure to Children with illness: Try to avoid obvious exposure; report any symptoms to [place] when noted,  If you have chicken pos, red measles or mumps, you should be immune to these diseases.   Please do not take any vaccines while pregnant unless you have checked with your OB provider.  Fetal Movement: After 28 weeks we recommend you do  "kick counts" twice daily.  Lie or sit down in a calm quiet environment and count your baby movements "kicks".  You should feel your baby at least 10 times per hour.  If you have not felt 10 kicks within the first hour get up, walk around and have something sweet to eat or drink then repeat for an additional hour.  If count remains less than 10 per hour notify your provider.  Fumigating: Follow your pest control agent's advice as to how long to stay out of your home.  Ventilate the area well before re-entering.  Hemorrhoids:   Most over-the-counter preparations can be used during pregnancy.  Check your medication to see what is safe to use.  It is important to use a stool softener or fiber in your diet and to drink lots of liquids.  If hemorrhoids seem to be getting worse please call the office.   Hot Tubs:  Hot tubs Jacuzzis and saunas are not recommended while pregnant.  These increase your internal body temperature and should be avoided.  Intercourse:  Sexual intercourse is safe during pregnancy as long as you are comfortable, unless otherwise advised by your provider.  Spotting may occur after intercourse; report any bright red bleeding that is heavier than spotting.  Labor:  If you know that you are in labor, please go to the hospital.  If you are unsure, please call the office and let us help you decide what to do.  Lifting, straining, etc:  If your job requires heavy lifting or straining please check with your provider for any limitations.  Generally, you should not lift items heavier than that you can lift simply with your hands and arms (no back muscles)  Painting:  Paint fumes do not harm your pregnancy, but may make you ill and should be avoided if possible.  Latex or water based paints have less odor than oils.  Use adequate ventilation while painting.  Permanents & Hair Color:  Chemicals in hair dyes are not recommended as they cause increase hair dryness which can increase hair loss  during pregnancy.  " Highlighting" and permanents are allowed.  Dye may be absorbed differently and permanents may not hold as well during pregnancy.  Sunbathing:  Use a sunscreen, as skin burns easily during pregnancy.  Drink plenty of fluids; avoid over heating.  Tanning Beds:  Because their possible side effects are still unknown, tanning beds are not recommended.  Ultrasound Scans:  Routine ultrasounds are performed at approximately 20 weeks.  You will be able to see your baby's general anatomy an if you would like to know the gender this can usually be determined as well.  If it is questionable when you conceived you may also receive an ultrasound early in your pregnancy for dating purposes.  Otherwise ultrasound exams are not routinely performed unless there is a medical necessity.  Although you can request a scan we ask that you pay for it when conducted because insurance does not cover " patient request" scans.  Work: If your pregnancy proceeds without complications you may work until your due date, unless your physician or employer advises otherwise.  Round Ligament Pain/Pelvic Discomfort:  Sharp, shooting pains not associated with bleeding are fairly common, usually occurring in the second trimester of pregnancy.  They tend to be worse when standing up or when you remain standing for long periods of time.  These are the result of pressure of certain pelvic ligaments called "round ligaments".  Rest, Tylenol and heat seem to be the most effective relief.  As the womb and fetus grow, they rise out of the pelvis and the discomfort improves.  Please notify the office if your pain seems different than that described.  It may represent a more serious condition.  Common Medications Safe in Pregnancy  Acne:      Constipation:  Benzoyl Peroxide     Colace  Clindamycin      Dulcolax Suppository  Topica Erythromycin     Fibercon  Salicylic Acid      Metamucil         Miralax AVOID:        Senakot    Accutane    Cough:  Retin-A       Cough Drops  Tetracycline      Phenergan w/ Codeine if Rx  Minocycline      Robitussin (Plain & DM)  Antibiotics:     Crabs/Lice:  Ceclor       RID  Cephalosporins    AVOID:  E-Mycins      Kwell  Keflex  Macrobid/Macrodantin   Diarrhea:  Penicillin      Kao-Pectate  Zithromax      Imodium AD         PUSH FLUIDS AVOID:       Cipro     Fever:  Tetracycline      Tylenol (Regular or Extra  Minocycline       Strength)  Levaquin      Extra Strength-Do not          Exceed 8 tabs/24 hrs Caffeine:        '200mg'$ /day (equiv. To 1 cup of coffee or  approx. 3 12 oz sodas)         Gas: Cold/Hayfever:       Gas-X  Benadryl      Mylicon  Claritin       Phazyme  **Claritin-D        Chlor-Trimeton    Headaches:  Dimetapp      ASA-Free Excedrin  Drixoral-Non-Drowsy     Cold Compress  Mucinex (Guaifenasin)     Tylenol (Regular or Extra  Sudafed/Sudafed-12 Hour     Strength)  **Sudafed PE Pseudoephedrine   Tylenol Cold & Sinus     Vicks Vapor Rub  Zyrtec  **AVOID if Problems With Blood Pressure         Heartburn: Avoid lying down for at least 1 hour after meals  Aciphex      Maalox     Rash:  Milk of Magnesia     Benadryl    Mylanta       1% Hydrocortisone Cream  Pepcid  Pepcid Complete   Sleep Aids:  Prevacid      Ambien   Prilosec       Benadryl  Rolaids  Chamomile Tea  Tums (Limit 4/day)     Unisom  Zantac       Tylenol PM         Warm milk-add vanilla or  Hemorrhoids:       Sugar for taste  Anusol/Anusol H.C.  (RX: Analapram 2.5%)  Sugar Substitutes:  Hydrocortisone OTC     Ok in moderation  Preparation H      Tucks        Vaseline lotion applied to tissue with wiping    Herpes:     Throat:  Acyclovir      Oragel  Famvir  Valtrex     Vaccines:         Flu Shot Leg Cramps:       *Gardasil  Benadryl      Hepatitis A         Hepatitis B Nasal Spray:       Pneumovax  Saline Nasal Spray     Polio Booster         Tetanus  Nausea:       Tuberculosis test or PPD  Vitamin B6 25 mg TID   AVOID:    Dramamine      *Gardasil  Emetrol       Live Poliovirus  Ginger Root 250 mg QID    MMR (measles, mumps &  High Complex Carbs @ Bedtime    rebella)  Sea Bands-Accupressure    Varicella (Chickenpox)  Unisom 1/2 tab TID     *No known complications           If received before Pain:         Known pregnancy;   Darvocet       Resume series after  Lortab        Delivery  Percocet    Yeast:   Tramadol      Femstat  Tylenol 3      Gyne-lotrimin  Ultram       Monistat  Vicodin           MISC:         All Sunscreens           Hair Coloring/highlights          Insect Repellant's          (Including DEET)         Mystic Tans

## 2018-11-24 LAB — DRUG PROFILE, UR, 9 DRUGS (LABCORP)
Amphetamines, Urine: NEGATIVE ng/mL
Barbiturate Quant, Ur: NEGATIVE ng/mL
Benzodiazepine Quant, Ur: NEGATIVE ng/mL
Cannabinoid Quant, Ur: NEGATIVE ng/mL
Cocaine (Metab.): NEGATIVE ng/mL
Methadone Screen, Urine: NEGATIVE ng/mL
Opiate Quant, Ur: NEGATIVE ng/mL
PCP Quant, Ur: NEGATIVE ng/mL
Propoxyphene: NEGATIVE ng/mL

## 2018-11-24 LAB — TOXOPLASMA ANTIBODIES- IGG AND  IGM
Toxoplasma Antibody- IgM: <3 AU/mL (ref 0.0–7.9)
Toxoplasma IgG Ratio: <3 IU/mL (ref 0.0–7.1)

## 2018-11-24 LAB — HIV ANTIBODY (ROUTINE TESTING W REFLEX): HIV Screen 4th Generation wRfx: NONREACTIVE

## 2018-11-24 LAB — NICOTINE SCREEN, URINE: Cotinine Ql Scrn, Ur: NEGATIVE ng/mL

## 2018-11-24 LAB — ABO AND RH: Rh Factor: POSITIVE

## 2018-11-24 LAB — HEPATITIS B SURFACE ANTIGEN: Hepatitis B Surface Ag: NEGATIVE

## 2018-11-24 LAB — URINALYSIS, ROUTINE W REFLEX MICROSCOPIC
Bilirubin, UA: NEGATIVE
Glucose, UA: NEGATIVE
Ketones, UA: NEGATIVE
Leukocytes,UA: NEGATIVE
Nitrite, UA: NEGATIVE
RBC, UA: NEGATIVE
Specific Gravity, UA: 1.018 (ref 1.005–1.030)
Urobilinogen, Ur: 0.2 mg/dL (ref 0.2–1.0)
pH, UA: 8 — ABNORMAL HIGH (ref 5.0–7.5)

## 2018-11-24 LAB — HEMOGLOBIN A1C
Est. average glucose Bld gHb Est-mCnc: 94 mg/dL
Hgb A1c MFr Bld: 4.9 % (ref 4.8–5.6)

## 2018-11-24 LAB — VARICELLA ZOSTER ANTIBODY, IGG: Varicella zoster IgG: <135 index — ABNORMAL LOW (ref >165)

## 2018-11-24 LAB — RPR: RPR Ser Ql: NONREACTIVE

## 2018-11-24 LAB — RUBELLA SCREEN: Rubella Antibodies, IGG: 2.73 index (ref >0.99)

## 2018-11-24 LAB — TSH: TSH: 1.37 u[IU]/mL (ref 0.450–4.500)

## 2018-11-25 LAB — URINE CULTURE, OB REFLEX

## 2018-11-25 LAB — CULTURE, OB URINE

## 2018-11-27 LAB — GC/CHLAMYDIA PROBE AMP
Chlamydia trachomatis, NAA: NEGATIVE
Neisseria Gonorrhoeae by PCR: NEGATIVE

## 2018-12-09 ENCOUNTER — Encounter: Payer: Self-pay | Admitting: Certified Nurse Midwife

## 2018-12-09 ENCOUNTER — Other Ambulatory Visit: Payer: Self-pay

## 2018-12-09 ENCOUNTER — Ambulatory Visit (INDEPENDENT_AMBULATORY_CARE_PROVIDER_SITE_OTHER): Payer: BC Managed Care – PPO | Admitting: Certified Nurse Midwife

## 2018-12-09 VITALS — BP 103/58 | HR 105 | Wt 153.6 lb

## 2018-12-09 DIAGNOSIS — Z3490 Encounter for supervision of normal pregnancy, unspecified, unspecified trimester: Secondary | ICD-10-CM

## 2018-12-09 NOTE — Patient Instructions (Signed)

## 2018-12-09 NOTE — Progress Notes (Signed)
NEW OB HISTORY AND PHYSICAL  SUBJECTIVE:       Autumn DrownHeather Todd is a 33 y.o. [id number] female, Patient's last menstrual period was 09/19/2018 (exact date)., Estimated Date of Delivery: 06/22/19, 9234w1d, presents today for establishment of Prenatal Care. She has no unusual complaints   Gynecologic History Patient's last menstrual period was 09/19/2018 (exact date). Normal Contraception: none Last Pap: 10/31/16. Results were: normal  Body mass index is 29.99 kg/m.   Obstetric History OB History  Gravida Para Term Preterm AB Living  3 1 1     1   SAB TAB Ectopic Multiple Live Births        0 1    # Outcome Date GA Lbr Len/2nd Weight Sex Delivery Anes PTL Lv  3 Current           2 Term 04/27/15 451w1d  6 lb 0.7 oz (2.74 kg) F CS-LTranv Spinal, EPI  LIV  1 Gravida             Past Medical History:  Diagnosis Date  . Allergy   . Anemia 07/24/2015  . Anxiety 03/11/2018  . Asthma   . Sinus tachycardia     Past Surgical History:  Procedure Laterality Date  . CESAREAN SECTION N/A 04/27/2015   Procedure: CESAREAN SECTION;  Surgeon: Maben Bingharlie Pickens, MD;  Location: [facility]S;  Service: Obstetrics;  Laterality: N/A;  . WISDOM TOOTH EXTRACTION      Current Outpatient Medications on File Prior to Visit  Medication Sig Dispense Refill  . busPIRone (BUSPAR) 10 MG tablet Take 1 tablet (10 mg total) by mouth 2 (two) times daily. 180 tablet 1  . Doxylamine-Pyridoxine 10-10 MG TBEC Take 1 tablet by mouth 2 (two) times daily. 60 tablet 6  . Prenatal Vit-Fe Fumarate-FA (PRENATAL MULTIVITAMIN) TABS tablet Take 1 tablet by mouth daily at 12 noon.     No current facility-administered medications on file prior to visit.     No Known Allergies  Social History   Socioeconomic History  . Marital status: Married    Spouse name: Not on file  . Number of children: 0  . Years of education: Not on file  . Highest education level: Not on file  Occupational History  . Occupation: Runner, broadcasting/film/videoTeacher    Comment:  [employer]  Social Needs  . Financial resource strain: Not on file  . Food insecurity    Worry: Not on file    Inability: Not on file  . Transportation needs    Medical: Not on file    Non-medical: Not on file  Tobacco Use  . Smoking status: Never Smoker  . Smokeless tobacco: Never Used  Substance and Sexual Activity  . Alcohol use: No    Alcohol/week: 0.0 standard drinks  . Drug use: No  . Sexual activity: Yes    Partners: Male    Birth control/protection: None  Lifestyle  . Physical activity    Days per week: Not on file    Minutes per session: Not on file  . Stress: Not on file  Relationships  . Social Musicianconnections    Talks on phone: Not on file    Gets together: Not on file    Attends religious service: Not on file    Active member of club or organization: Not on file    Attends meetings of clubs or organizations: Not on file    Relationship status: Not on file  . Intimate partner violence    Fear of current or ex partner:  Not on file    Emotionally abused: Not on file    Physically abused: Not on file    Forced sexual activity: Not on file  Other Topics Concern  . Not on file  Social History Narrative  . Not on file    Family History  Problem Relation Age of Onset  . Cancer Maternal Grandfather     The following portions of the patient's history were reviewed and updated as appropriate: allergies, current medications, past OB history, past medical history, past surgical history, past family history, past social history, and problem list.    OBJECTIVE: Initial Physical Exam (New OB)  GENERAL APPEARANCE: alert, well appearing, in no apparent distress, oriented to person, place and time, overweight HEAD: normocephalic, atraumatic MOUTH: mucous membranes moist, pharynx normal without lesions THYROID: no thyromegaly or masses present BREASTS: no masses noted, no significant tenderness, no palpable axillary nodes, no skin changes, nevous on right breast has not  changed.  LUNGS: clear to auscultation, no wheezes, rales or rhonchi, symmetric air entry HEART: regular rate and rhythm, no murmurs, tachycardia, pt state she has had checked out, has no problem with it in her last pregnancy  ABDOMEN: soft, nontender, nondistended, no abnormal masses, no epigastric pain, obese and FHT present EXTREMITIES: no redness or tenderness in the calves or thighs, no edema, no limitation in range of motion SKIN: normal coloration and turgor, no rashes LYMPH NODES: no adenopathy palpable NEUROLOGIC: alert, oriented, normal speech, no focal findings or movement disorder noted  PELVIC EXAM EXTERNAL GENITALIA: normal appearing vulva with no masses, tenderness or lesions VAGINA: no abnormal discharge or lesions CERVIX: no lesions or cervical motion tenderness UTERUS: gravid ADNEXA: no masses palpable and nontender OB EXAM PELVIMETRY: appears adequate RECTUM: exam not indicated  ASSESSMENT: Normal pregnancy  PLAN: New OB counseling: The patient has been given an overview regarding routine prenatal care. Recommendations regarding diet, weight gain, and exercise in pregnancy were given. Prenatal testing, optional genetic testing,carrier screening test,  and ultrasound use in pregnancy were reviewed. Panoarma testing today. Benefits of Breast Feeding were discussed. The patient is encouraged to consider nursing her baby post partum. She pumped for 2 moths with first pregnancy. Is unsure if she wants repeat c/section vs. TOLAC. Discussed red flag symptoms in relation to tachycardia she verbalizes understanding and agrees to report any symptoms.  Follow up 4 wks for ROB.   Philip Aspen, CNM

## 2018-12-10 LAB — CBC
Hematocrit: 35.9 % (ref 34.0–46.6)
Hemoglobin: 12 g/dL (ref 11.1–15.9)
MCH: 29.4 pg (ref 26.6–33.0)
MCHC: 33.4 g/dL (ref 31.5–35.7)
MCV: 88 fL (ref 79–97)
Platelets: 319 10*3/uL (ref 150–450)
RBC: 4.08 x10E6/uL (ref 3.77–5.28)
RDW: 11.9 % (ref 11.7–15.4)
WBC: 9.4 10*3/uL (ref 3.4–10.8)

## 2018-12-11 ENCOUNTER — Encounter: Payer: BC Managed Care – PPO | Admitting: Certified Nurse Midwife

## 2018-12-22 ENCOUNTER — Ambulatory Visit: Payer: BC Managed Care – PPO | Admitting: Family Medicine

## 2018-12-22 ENCOUNTER — Encounter: Payer: Self-pay | Admitting: Family Medicine

## 2018-12-23 ENCOUNTER — Ambulatory Visit (INDEPENDENT_AMBULATORY_CARE_PROVIDER_SITE_OTHER): Payer: BC Managed Care – PPO | Admitting: Family Medicine

## 2018-12-23 ENCOUNTER — Encounter: Payer: Self-pay | Admitting: Family Medicine

## 2018-12-23 ENCOUNTER — Other Ambulatory Visit: Payer: Self-pay

## 2018-12-23 DIAGNOSIS — F419 Anxiety disorder, unspecified: Secondary | ICD-10-CM | POA: Diagnosis not present

## 2018-12-23 DIAGNOSIS — O99340 Other mental disorders complicating pregnancy, unspecified trimester: Secondary | ICD-10-CM | POA: Diagnosis not present

## 2018-12-23 MED ORDER — BUSPIRONE HCL 7.5 MG PO TABS: 7.5000 mg | ORAL_TABLET | Freq: Two times a day (BID) | ORAL | 2 refills | Status: DC

## 2018-12-23 NOTE — Progress Notes (Signed)
Name: Autumn Todd   MRN: [id number]    DOB: 05-18-1985   Date:12/23/2018       Progress Note  Subjective  Chief Complaint  Chief Complaint  Patient presents with  . Follow-up    6 weeks recheck    I connected with  Autumn Todd  on 12/23/18 at  7:20 AM EDT by a video enabled telemedicine application and verified that I am speaking with the correct person using two identifiers.  I discussed the limitations of evaluation and management by telemedicine and the availability of in person appointments. The patient expressed understanding and agreed to proceed. Staff also discussed with the patient that there may be a patient responsible charge related to this service. Patient Location: Home Provider Location: Office Additional Individuals present: None  HPI   Pt presents to discuss her anxiety medication in relation to her pregnancy.  She is about 14 weeks today.  She had been taking buspar for a couple of months prior to the pregnancy at 15mg  TID, after becoming pregnant she wanted to decrease her dosing as it was quite effective, but wanted to use the lowest effective dose.  She decreased to 10mg  BID after our last visit and has done really well on this.  No panic attacks, sleeping well at night, anxiety is well controlled.  We will continue to decrease her dose - decrease to 7.5mg  BID today.  She will follow up if wanting to decrease further to 5mg  BID depending on her symptoms. Again advised that Buspar is considered old category B, and is noted to be able to be used during pregnancy per guidelines. She is currently on 15mg  TID of buspar. We did discuss zoloft and SSRI therapies in detail.  Reviewed drug data on both Zoloft and Buspar and reviewed risk/benefit profile of each medication again today. Will hold off on switching to zoloft for now, but she knows that this is an option if she would like to try this at any point.   Patient Active Problem List   Diagnosis Date Noted  .  Environmental allergies 09/29/2018  . Anxiety 03/11/2018  . Aortic valve regurgitation 10/01/2017  . Mitral regurgitation 10/01/2017  . 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 De Quervain's tenosynovitis, left 06/06/2015  . Eczema of right hand 06/06/2015  . Asthma, mild intermittent 10/11/2014    Past Surgical History:  Procedure Laterality Date  . CESAREAN SECTION N/A 04/27/2015   Procedure: CESAREAN SECTION;  Surgeon: Aletha Halim, MD;  Location: [facility];  Service: Obstetrics;  Laterality: N/A;  . WISDOM TOOTH EXTRACTION      Family History  Problem Relation Age of Onset  . Cancer Maternal Grandfather     Social History   Socioeconomic History  . Marital status: Married    Spouse name: Not on file  . Number of children: 0  . Years of education: Not on file  . Highest education level: Not on file  Occupational History  . Occupation: Pharmacist, hospital    Comment: [employer]  Social Needs  . Financial resource strain: Not on file  . Food insecurity    Worry: Not on file    Inability: Not on file  . Transportation needs    Medical: Not on file    Non-medical: Not on file  Tobacco Use  . Smoking status: Never Smoker  . Smokeless tobacco: Never Used  Substance and Sexual Activity  . Alcohol use: No    Alcohol/week: 0.0 standard drinks  .  Drug use: No  . Sexual activity: Yes    Partners: Male    Birth control/protection: None  Lifestyle  . Physical activity    Days per week: Not on file    Minutes per session: Not on file  . Stress: Not on file  Relationships  . Social Musician on phone: Not on file    Gets together: Not on file    Attends religious service: Not on file    Active member of club or organization: Not on file    Attends meetings of clubs or organizations: Not on file    Relationship status: Not on file  . Intimate partner violence    Fear of current or ex partner: Not on file    Emotionally abused: Not on  file    Physically abused: Not on file    Forced sexual activity: Not on file  Other Topics Concern  . Not on file  Social History Narrative  . Not on file     Current Outpatient Medications:  .  busPIRone (BUSPAR) 10 MG tablet, Take 1 tablet (10 mg total) by mouth 2 (two) times daily., Disp: 180 tablet, Rfl: 1 .  Doxylamine-Pyridoxine 10-10 MG TBEC, Take 1 tablet by mouth 2 (two) times daily., Disp: 60 tablet, Rfl: 6 .  Prenatal Vit-Fe Fumarate-FA (PRENATAL MULTIVITAMIN) TABS tablet, Take 1 tablet by mouth daily at 12 noon., Disp: , Rfl:  .  levocetirizine (XYZAL) 5 MG tablet, , Disp: , Rfl:   No Known Allergies  I personally reviewed active problem list, medication list, allergies, notes from last encounter with the patient/caregiver today.   ROS  Constitutional: Negative for fever or weight change.  Respiratory: Negative for cough and shortness of breath.   Cardiovascular: Negative for chest pain or palpitations.  Gastrointestinal: Negative for abdominal pain, no bowel changes.  Musculoskeletal: Negative for gait problem or joint swelling.  Skin: Negative for rash.  Neurological: Negative for dizziness or headache.  No other specific complaints in a complete review of systems (except as listed in HPI above).   Objective  Virtual encounter, vitals not obtained.  There is no height or weight on file to calculate BMI.  Physical Exam  Constitutional: Patient appears well-developed and well-nourished. No distress.  HENT: Head: Normocephalic and atraumatic.  Neck: Normal range of motion. Pulmonary/Chest: Effort normal. No respiratory distress. Speaking in complete sentences Neurological: Pt is alert and oriented to person, place, and time. Coordination, speech are normal.  Psychiatric: Patient has a normal mood and affect. behavior is normal. Judgment and thought content normal.  No results found for this or any previous visit (from the past 72 hour(s)).  PHQ2/9:  Depression screen [facility] 2/9 12/23/2018 11/09/2018 09/29/2018 03/11/2018 02/13/2018  Decreased Interest 0 0 0 0 0  Down, Depressed, Hopeless 0 0 0 0 1  PHQ - 2 Score 0 0 0 0 1  Altered sleeping 0 0 0 0 0  Tired, decreased energy 0 0 0 0 1  Change in appetite 0 0 0 0 1  Feeling bad or failure about yourself  0 0 0 0 0  Trouble concentrating 0 0 0 0 0  Moving slowly or fidgety/restless 0 0 0 0 0  Suicidal thoughts 0 0 0 0 0  PHQ-9 Score 0 0 0 0 3  Difficult doing work/chores Not difficult at all Not difficult at all Not difficult at all Not difficult at all Somewhat difficult   PHQ-2/9 Result is negative.  Fall Risk: Fall Risk  12/23/2018 11/09/2018 09/29/2018 03/11/2018 02/13/2018  Falls in the past year? 0 0 0 0 0  Number falls in past yr: 0 0 0 0 0  Injury with Fall? 0 0 0 - -  Follow up Falls evaluation completed - - - -     Assessment & Plan  1. Anxiety during pregnancy - Decrease to 7.5mg  BID; will call back if wanting to go down further to 5mg  BID.  She is tolerating lower dose well and will follow up PRN at this point.  - busPIRone (BUSPAR) 7.5 MG tablet; Take 1 tablet (7.5 mg total) by mouth 2 (two) times daily.  Dispense: 60 tablet; Refill: 2  I discussed the assessment and treatment plan with the patient. The patient was provided an opportunity to ask questions and all were answered. The patient agreed with the plan and demonstrated an understanding of the instructions.  The patient was advised to call back or seek an in-person evaluation if the symptoms worsen or if the condition fails to improve as anticipated.  I provided 10 minutes of non-face-to-face time during this encounter.

## 2019-01-06 ENCOUNTER — Ambulatory Visit (INDEPENDENT_AMBULATORY_CARE_PROVIDER_SITE_OTHER): Payer: BC Managed Care – PPO | Admitting: Obstetrics and Gynecology

## 2019-01-06 ENCOUNTER — Other Ambulatory Visit: Payer: Self-pay

## 2019-01-06 VITALS — BP 123/60 | HR 112 | Wt 157.7 lb

## 2019-01-06 DIAGNOSIS — Z23 Encounter for immunization: Secondary | ICD-10-CM

## 2019-01-06 DIAGNOSIS — Z3492 Encounter for supervision of normal pregnancy, unspecified, second trimester: Secondary | ICD-10-CM

## 2019-01-06 LAB — POCT URINALYSIS DIPSTICK OB
Bilirubin, UA: NEGATIVE
Blood, UA: NEGATIVE
Glucose, UA: NEGATIVE
Ketones, UA: NEGATIVE
Leukocytes, UA: NEGATIVE
Nitrite, UA: NEGATIVE
POC,PROTEIN,UA: NEGATIVE
Spec Grav, UA: 1.01 (ref 1.010–1.025)
Urobilinogen, UA: 0.2 E.U./dL
pH, UA: 6.5 (ref 5.0–8.0)

## 2019-01-06 NOTE — Progress Notes (Signed)
ROB- pt is doing well, flu vaccine given 

## 2019-01-06 NOTE — Progress Notes (Signed)
ROB- doing well, flu vaccine given;states afternoon nausea is better and plans to stop meds this weekend. Anatomy scan next visit.

## 2019-02-03 ENCOUNTER — Ambulatory Visit (INDEPENDENT_AMBULATORY_CARE_PROVIDER_SITE_OTHER): Payer: BC Managed Care – PPO

## 2019-02-03 ENCOUNTER — Other Ambulatory Visit: Payer: Self-pay | Admitting: Obstetrics and Gynecology

## 2019-02-03 ENCOUNTER — Other Ambulatory Visit: Payer: Self-pay

## 2019-02-03 DIAGNOSIS — Z363 Encounter for antenatal screening for malformations: Secondary | ICD-10-CM

## 2019-02-03 DIAGNOSIS — Z3492 Encounter for supervision of normal pregnancy, unspecified, second trimester: Secondary | ICD-10-CM

## 2019-02-10 ENCOUNTER — Other Ambulatory Visit: Payer: BC Managed Care – PPO

## 2019-02-11 ENCOUNTER — Encounter: Payer: BC Managed Care – PPO | Admitting: Certified Nurse Midwife

## 2019-02-11 ENCOUNTER — Other Ambulatory Visit: Payer: Self-pay

## 2019-02-11 ENCOUNTER — Ambulatory Visit (INDEPENDENT_AMBULATORY_CARE_PROVIDER_SITE_OTHER): Payer: BC Managed Care – PPO | Admitting: Certified Nurse Midwife

## 2019-02-11 VITALS — BP 109/70 | HR 105 | Wt 164.5 lb

## 2019-02-11 DIAGNOSIS — O34219 Maternal care for unspecified type scar from previous cesarean delivery: Secondary | ICD-10-CM

## 2019-02-11 DIAGNOSIS — O09899 Supervision of other high risk pregnancies, unspecified trimester: Secondary | ICD-10-CM | POA: Insufficient documentation

## 2019-02-11 DIAGNOSIS — Z98891 History of uterine scar from previous surgery: Secondary | ICD-10-CM

## 2019-02-11 DIAGNOSIS — Z283 Underimmunization status: Secondary | ICD-10-CM

## 2019-02-11 DIAGNOSIS — Z2839 Other underimmunization status: Secondary | ICD-10-CM | POA: Insufficient documentation

## 2019-02-11 DIAGNOSIS — O09292 Supervision of pregnancy with other poor reproductive or obstetric history, second trimester: Secondary | ICD-10-CM

## 2019-02-11 DIAGNOSIS — Z3492 Encounter for supervision of normal pregnancy, unspecified, second trimester: Secondary | ICD-10-CM

## 2019-02-11 DIAGNOSIS — O09892 Supervision of other high risk pregnancies, second trimester: Secondary | ICD-10-CM

## 2019-02-11 DIAGNOSIS — Z3A20 20 weeks gestation of pregnancy: Secondary | ICD-10-CM

## 2019-02-11 DIAGNOSIS — O09299 Supervision of pregnancy with other poor reproductive or obstetric history, unspecified trimester: Secondary | ICD-10-CM

## 2019-02-11 LAB — POCT URINALYSIS DIPSTICK OB
Bilirubin, UA: NEGATIVE
Blood, UA: NEGATIVE
Glucose, UA: NEGATIVE
Ketones, UA: NEGATIVE
Nitrite, UA: NEGATIVE
POC,PROTEIN,UA: NEGATIVE
Spec Grav, UA: 1.01 (ref 1.010–1.025)
Urobilinogen, UA: 0.2 E.U./dL
pH, UA: 6.5 (ref 5.0–8.0)

## 2019-02-11 NOTE — Progress Notes (Signed)
ROB-Reports occasional heartburn. Discussed home treatment measures. Anatomy scan from last week reviewed with patient, verbalized understanding-having boy. Ready, Set, Baby information given. Reviewed red flag symptoms and when to call. RTC x 3-4 weeks for ROB or sooner if needed.   ULTRASOUND REPORT  Location: [facility] Date of Service: 02/03/2019   Indications:Anatomy Ultrasound Findings:  Singleton intrauterine pregnancy is visualized with FHR at 142 BPM. Biometrics give an (U/S) Gestational age of [redacted]w[redacted]d and an (U/S) EDD of 06/22/2019; this correlates with the clinically established Estimated Date of Delivery: 06/22/19  Fetal presentation is Variable.  EFW: 306 g ( 11 oz ). Placenta: posterior. Grade: 1 AFI: subjectively normal.  Anatomic survey is complete and normal; Gender - female.    Right Ovary is normal in appearance. Left Ovary is normal appearance. Survey of the adnexa demonstrates no adnexal masses. There is no free peritoneal fluid in the cul de sac.  Impression: 1. [redacted]w[redacted]d Viable Singleton Intrauterine pregnancy by U/S. 2. (U/S) EDD is consistent with Clinically established Estimated Date of Delivery: 06/22/19 . 3. Normal Anatomy Scan  Recommendations: 1.Clinical correlation with the patient's History and Physical Exam.

## 2019-02-11 NOTE — Patient Instructions (Addendum)
Round Ligament Pain  The round ligament is a cord of muscle and tissue that helps support the uterus. It can become a source of pain during pregnancy if it becomes stretched or twisted as the baby grows. The pain usually begins in the second trimester (13-28 weeks) of pregnancy, and it can come and go until the baby is delivered. It is not a serious problem, and it does not cause harm to the baby. Round ligament pain is usually a short, sharp, and pinching pain, but it can also be a dull, lingering, and aching pain. The pain is felt in the lower side of the abdomen or in the groin. It usually starts deep in the groin and moves up to the outside of the hip area. The pain may occur when you:  Suddenly change position, such as quickly going from a sitting to standing position.  Roll over in bed.  Cough or sneeze.  Do physical activity. Follow these instructions at home:   Watch your condition for any changes.  When the pain starts, relax. Then try any of these methods to help with the pain: ? Sitting down. ? Flexing your knees up to your abdomen. ? Lying on your side with one pillow under your abdomen and another pillow between your legs. ? Sitting in a warm bath for 15-20 minutes or until the pain goes away.  Take over-the-counter and prescription medicines only as told by your health care provider.  Move slowly when you sit down or stand up.  Avoid long walks if they cause pain.  Stop or reduce your physical activities if they cause pain.  Keep all follow-up visits as told by your health care provider. This is important. Contact a health care provider if:  Your pain does not go away with treatment.  You feel pain in your back that you did not have before.  Your medicine is not helping. Get help right away if:  You have a fever or chills.  You develop uterine contractions.  You have vaginal bleeding.  You have nausea or vomiting.  You have diarrhea.  You have pain  when you urinate. Summary  Round ligament pain is felt in the lower abdomen or groin. It is usually a short, sharp, and pinching pain. It can also be a dull, lingering, and aching pain.  This pain usually begins in the second trimester (13-28 weeks). It occurs because the uterus is stretching with the growing baby, and it is not harmful to the baby.  You may notice the pain when you suddenly change position, when you cough or sneeze, or during physical activity.  Relaxing, flexing your knees to your abdomen, lying on one side, or taking a warm bath may help to get rid of the pain.  Get help from your health care provider if the pain does not go away or if you have vaginal bleeding, nausea, vomiting, diarrhea, or painful urination. This information is not intended to replace advice given to you by your health care provider. Make sure you discuss any questions you have with your health care provider. Document Released: 01/09/2008 Document Revised: 09/17/2017 Document Reviewed: 09/17/2017 Elsevier Patient Education  [place]. Common Medications Safe in Pregnancy  Acne:      Constipation:  Benzoyl Peroxide     Colace  Clindamycin      Dulcolax Suppository  Topica Erythromycin     Fibercon  Salicylic Acid      Metamucil  Miralax AVOID:        Senakot   Accutane    Cough:  Retin-A       Cough Drops  Tetracycline      Phenergan w/ Codeine if Rx  Minocycline      Robitussin (Plain & DM)  Antibiotics:     Crabs/Lice:  Ceclor       RID  Cephalosporins    AVOID:  E-Mycins      Kwell  Keflex  Macrobid/Macrodantin   Diarrhea:  Penicillin      Kao-Pectate  Zithromax      Imodium AD         PUSH FLUIDS AVOID:       Cipro     Fever:  Tetracycline      Tylenol (Regular or Extra  Minocycline       Strength)  Levaquin      Extra Strength-Do not          Exceed 8 tabs/24 hrs Caffeine:        <258m/day (equiv. To 1 cup of coffee or  approx. 3 12 oz sodas)         Gas:  Cold/Hayfever:       Gas-X  Benadryl      Mylicon  Claritin       Phazyme  **Claritin-D        Chlor-Trimeton    Headaches:  Dimetapp      ASA-Free Excedrin  Drixoral-Non-Drowsy     Cold Compress  Mucinex (Guaifenasin)     Tylenol (Regular or Extra  Sudafed/Sudafed-12 Hour     Strength)  **Sudafed PE Pseudoephedrine   Tylenol Cold & Sinus     Vicks Vapor Rub  Zyrtec  **AVOID if Problems With Blood Pressure         Heartburn: Avoid lying down for at least 1 hour after meals  Aciphex      Maalox     Rash:  Milk of Magnesia     Benadryl    Mylanta       1% Hydrocortisone Cream  Pepcid  Pepcid Complete   Sleep Aids:  Prevacid      Ambien   Prilosec       Benadryl  Rolaids       Chamomile Tea  Tums (Limit 4/day)     Unisom  Zantac       Tylenol PM         Warm milk-add vanilla or  Hemorrhoids:       Sugar for taste  Anusol/Anusol H.C.  (RX: Analapram 2.5%)  Sugar Substitutes:  Hydrocortisone OTC     Ok in moderation  Preparation H      Tucks        Vaseline lotion applied to tissue with wiping    Herpes:     Throat:  Acyclovir      Oragel  Famvir  Valtrex     Vaccines:         Flu Shot Leg Cramps:       *Gardasil  Benadryl      Hepatitis A         Hepatitis B Nasal Spray:       Pneumovax  Saline Nasal Spray     Polio Booster         Tetanus Nausea:       Tuberculosis test or PPD  Vitamin B6 25 mg TID   AVOID:    Dramamine      *  Gardasil  Emetrol       Live Poliovirus  Ginger Root 250 mg QID    MMR (measles, mumps &  High Complex Carbs @ Bedtime    rebella)  Sea Bands-Accupressure    Varicella (Chickenpox)  Unisom 1/2 tab TID     *No known complications           If received before Pain:         Known pregnancy;   Darvocet       Resume series after  Lortab        Delivery  Percocet    Yeast:   Tramadol      Femstat  Tylenol 3      Gyne-lotrimin  Ultram       Monistat  Vicodin           MISC:         All Sunscreens           Hair Coloring/highlights           Insect Repellant's          (Including DEET)         Mystic Tans Back Pain in Pregnancy Back pain during pregnancy is common. Back pain may be caused by several factors that are related to changes during your pregnancy. Follow these instructions at home: Managing pain, stiffness, and swelling      If directed, for sudden (acute) back pain, put ice on the painful area. ? Put ice in a plastic bag. ? Place a towel between your skin and the bag. ? Leave the ice on for 20 minutes, 2-3 times per day.  If directed, apply heat to the affected area before you exercise. Use the heat source that your health care provider recommends, such as a moist heat pack or a heating pad. ? Place a towel between your skin and the heat source. ? Leave the heat on for 20-30 minutes. ? Remove the heat if your skin turns bright red. This is especially important if you are unable to feel pain, heat, or cold. You may have a greater risk of getting burned.  If directed, massage the affected area. Activity  Exercise as told by your health care provider. Gentle exercise is the best way to prevent or manage back pain.  Listen to your body when lifting. If lifting hurts, ask for help or bend your knees. This uses your leg muscles instead of your back muscles.  Squat down when picking up something from the floor. Do not bend over.  Only use bed rest for short periods as told by your health care provider. Bed rest should only be used for the most severe episodes of back pain. Standing, sitting, and lying down  Do not stand in one place for long periods of time.  Use good posture when sitting. Make sure your head rests over your shoulders and is not hanging forward. Use a pillow on your lower back if necessary.  Try sleeping on your side, preferably the left side, with a pregnancy support pillow or 1-2 regular pillows between your legs. ? If you have back pain after a night's rest, your bed may be too  soft. ? A firm mattress may provide more support for your back during pregnancy. General instructions  Do not wear high heels.  Eat a healthy diet. Try to gain weight within your health care provider's recommendations.  Use a maternity girdle, elastic sling, or back brace as told by  your health care provider.  Take over-the-counter and prescription medicines only as told by your health care provider.  Work with a physical therapist or massage therapist to find ways to manage back pain. Acupuncture or massage therapy may be helpful.  Keep all follow-up visits as told by your health care provider. This is important. Contact a health care provider if:  Your back pain interferes with your daily activities.  You have increasing pain in other parts of your body. Get help right away if:  You develop numbness, tingling, weakness, or problems with the use of your arms or legs.  You develop severe back pain that is not controlled with medicine.  You have a change in bowel or bladder control.  You develop shortness of breath, dizziness, or you faint.  You develop nausea, vomiting, or sweating.  You have back pain that is a rhythmic, cramping pain similar to labor pains. Labor pain is usually 1-2 minutes apart, lasts for about 1 minute, and involves a bearing down feeling or pressure in your pelvis.  You have back pain and your water breaks or you have vaginal bleeding.  You have back pain or numbness that travels down your leg.  Your back pain developed after you fell.  You develop pain on one side of your back.  You see blood in your urine.  You develop skin blisters in the area of your back pain. Summary  Back pain may be caused by several factors that are related to changes during your pregnancy.  Follow instructions as told by your health care provider for managing pain, stiffness, and swelling.  Exercise as told by your health care provider. Gentle exercise is the best  way to prevent or manage back pain.  Take over-the-counter and prescription medicines only as told by your health care provider.  Keep all follow-up visits as told by your health care provider. This is important. This information is not intended to replace advice given to you by your health care provider. Make sure you discuss any questions you have with your health care provider. Document Released: 07/10/2005 Document Revised: 07/21/2018 Document Reviewed: 09/17/2017 Elsevier Patient Education  [place].  Heartburn During Pregnancy  Heartburn is pain or discomfort in the throat or chest. It may cause a burning feeling. It happens when stomach acid moves up into the tube that carries food from your mouth to your stomach (esophagus). Heartburn is common during pregnancy. It usually goes away or gets better after giving birth. Follow these instructions at home: Eating and drinking  Do not drink alcohol while you are pregnant.  Figure out which foods and beverages make you feel worse, and avoid them.  Beverages that you may want to avoid include: ? Coffee and tea (with or without caffeine). ? Energy drinks and sports drinks. ? Bubbly (carbonated) drinks or sodas. ? Citrus fruit juices.  Foods that you may want to avoid include: ? Chocolate and cocoa. ? Peppermint and mint flavorings. ? Garlic, onions, and horseradish. ? Spicy and acidic foods. These include peppers, chili powder, curry powder, vinegar, hot sauces, and barbecue sauce. ? Citrus fruits, such as oranges, lemons, and limes. ? Tomato-based foods, such as red sauce, chili, and salsa. ? Fried and fatty foods, such as donuts, french fries, potato chips, and high-fat dressings. ? High-fat meats, such as hot dogs, cold cuts, sausage, ham, and bacon. ? High-fat dairy items, such as whole milk, butter, and cheese.  Eat small meals often, instead of large  meals.  Avoid drinking a lot of liquid with your meals.  Avoid  eating meals during the 2-3 hours before you go to bed.  Avoid lying down right after you eat.  Do not exercise right after you eat. Medicines  Take over-the-counter and prescription medicines only as told by your doctor.  Do not take aspirin, ibuprofen, or other NSAIDs unless your doctor tells you to do that.  Your doctor may tell you to avoid medicines that have sodium bicarbonate in them. General instructions   If told, raise the head of your bed about 6 inches (15 cm). You can do this by putting blocks under the legs. Sleeping with more pillows does not help with heartburn.  Do not use any products that contain nicotine or tobacco, such as cigarettes and e-cigarettes. If you need help quitting, ask your doctor.  Wear loose-fitting clothing.  Try to lower your stress, such as with yoga or meditation. If you need help, ask your doctor.  Stay at a healthy weight. If you are overweight, work with your doctor to safely lose weight.  Keep all follow-up visits as told by your doctor. This is important. Contact a doctor if:  You get new symptoms.  Your symptoms do not get better with treatment.  You have weight loss and you do not know why.  You have trouble swallowing.  You make loud sounds when you breathe (wheeze).  You have a cough that does not go away.  You have heartburn often for more than 2 weeks.  You feel sick to your stomach (nauseous), and this does not get better with treatment.  You are throwing up (vomiting), and this does not get better with treatment.  You have pain in your belly (abdomen). Get help right away if:  You have very bad chest pain that spreads to your arm, neck, or jaw.  You feel sweaty, dizzy, or light-headed.  You have trouble breathing.  You have pain when swallowing.  You throw up and your throw-up looks like blood or coffee grounds.  Your poop (stool) is bloody or black. This information is not intended to replace advice  given to you by your health care provider. Make sure you discuss any questions you have with your health care provider. Document Released: 05/04/2010 Document Revised: 07/23/2018 Document Reviewed: 12/18/2015 Elsevier Patient Education  2020 [employer].

## 2019-02-11 NOTE — Progress Notes (Signed)
ROB-No complaints.  

## 2019-02-12 ENCOUNTER — Encounter: Payer: BC Managed Care – PPO | Admitting: Certified Nurse Midwife

## 2019-02-24 ENCOUNTER — Encounter: Payer: Self-pay | Admitting: Family Medicine

## 2019-03-03 ENCOUNTER — Ambulatory Visit (INDEPENDENT_AMBULATORY_CARE_PROVIDER_SITE_OTHER): Payer: BC Managed Care – PPO | Admitting: Certified Nurse Midwife

## 2019-03-03 ENCOUNTER — Other Ambulatory Visit: Payer: Self-pay

## 2019-03-03 VITALS — BP 119/65 | HR 106 | Wt 170.1 lb

## 2019-03-03 DIAGNOSIS — Z3480 Encounter for supervision of other normal pregnancy, unspecified trimester: Secondary | ICD-10-CM

## 2019-03-03 NOTE — Progress Notes (Signed)
ROB doing well. Feel good movement. Breast pump form completed today. Discussed glucose testing at next visit . Follow up 4 wks.   Philip Aspen, CNM

## 2019-03-03 NOTE — Patient Instructions (Signed)
Glucose Tolerance Test During Pregnancy Why am I having this test? The glucose tolerance test (GTT) is done to check how your body processes sugar (glucose). This is one of several tests used to diagnose diabetes that develops during pregnancy (gestational diabetes mellitus). Gestational diabetes is a temporary form of diabetes that some women develop during pregnancy. It usually occurs during the second trimester of pregnancy and goes away after delivery. Testing (screening) for gestational diabetes usually occurs between 24 and 28 weeks of pregnancy. You may have the GTT test after having a 1-hour glucose screening test if the results from that test indicate that you may have gestational diabetes. You may also have this test if:  You have a history of gestational diabetes.  You have a history of giving birth to very large babies or have experienced repeated fetal loss (stillbirth).  You have signs and symptoms of diabetes, such as: ? Changes in your vision. ? Tingling or numbness in your hands or feet. ? Changes in hunger, thirst, and urination that are not otherwise explained by your pregnancy. What is being tested? This test measures the amount of glucose in your blood at different times during a period of 3 hours. This indicates how well your body is able to process glucose. What kind of sample is taken?  Blood samples are required for this test. They are usually collected by inserting a needle into a blood vessel. How do I prepare for this test?  For 3 days before your test, eat normally. Have plenty of carbohydrate-rich foods.  Follow instructions from your health care provider about: ? Eating or drinking restrictions on the day of the test. You may be asked to not eat or drink anything other than water (fast) starting 8-10 hours before the test. ? Changing or stopping your regular medicines. Some medicines may interfere with this test. Tell a health care provider about:  All  medicines you are taking, including vitamins, herbs, eye drops, creams, and over-the-counter medicines.  Any blood disorders you have.  Any surgeries you have had.  Any medical conditions you have. What happens during the test? First, your blood glucose will be measured. This is referred to as your fasting blood glucose, since you fasted before the test. Then, you will drink a glucose solution that contains a certain amount of glucose. Your blood glucose will be measured again 1, 2, and 3 hours after drinking the solution. This test takes about 3 hours to complete. You will need to stay at the testing location during this time. During the testing period:  Do not eat or drink anything other than the glucose solution.  Do not exercise.  Do not use any products that contain nicotine or tobacco, such as cigarettes and e-cigarettes. If you need help stopping, ask your health care provider. The testing procedure may vary among health care providers and hospitals. How are the results reported? Your results will be reported as milligrams of glucose per deciliter of blood (mg/dL) or millimoles per liter (mmol/L). Your health care provider will compare your results to normal ranges that were established after testing a large group of people (reference ranges). Reference ranges may vary among labs and hospitals. For this test, common reference ranges are:  Fasting: less than 95-105 mg/dL (5.3-5.8 mmol/L).  1 hour after drinking glucose: less than 180-190 mg/dL (10.0-10.5 mmol/L).  2 hours after drinking glucose: less than 155-165 mg/dL (8.6-9.2 mmol/L).  3 hours after drinking glucose: 140-145 mg/dL (7.8-8.1 mmol/L). What do the   results mean? Results within reference ranges are considered normal, meaning that your glucose levels are well-controlled. If two or more of your blood glucose levels are high, you may be diagnosed with gestational diabetes. If only one level is high, your health care  provider may suggest repeat testing or other tests to confirm a diagnosis. Talk with your health care provider about what your results mean. Questions to ask your health care provider Ask your health care provider, or the department that is doing the test:  When will my results be ready?  How will I get my results?  What are my treatment options?  What other tests do I need?  What are my next steps? Summary  The glucose tolerance test (GTT) is one of several tests used to diagnose diabetes that develops during pregnancy (gestational diabetes mellitus). Gestational diabetes is a temporary form of diabetes that some women develop during pregnancy.  You may have the GTT test after having a 1-hour glucose screening test if the results from that test indicate that you may have gestational diabetes. You may also have this test if you have any symptoms or risk factors for gestational diabetes.  Talk with your health care provider about what your results mean. This information is not intended to replace advice given to you by your health care provider. Make sure you discuss any questions you have with your health care provider. Document Released: 10/01/2011 Document Revised: 07/23/2018 Document Reviewed: 11/11/2016 Elsevier Patient Education  2020 Else[employer] Inc.  

## 2019-03-22 ENCOUNTER — Other Ambulatory Visit: Payer: Self-pay | Admitting: Family Medicine

## 2019-03-22 DIAGNOSIS — F419 Anxiety disorder, unspecified: Secondary | ICD-10-CM

## 2019-03-24 ENCOUNTER — Encounter: Payer: Self-pay | Admitting: Family Medicine

## 2019-03-24 DIAGNOSIS — Z9109 Other allergy status, other than to drugs and biological substances: Secondary | ICD-10-CM

## 2019-03-26 MED ORDER — LORATADINE 10 MG PO TABS: 10.0000 mg | ORAL_TABLET | Freq: Every day | ORAL | 1 refills | Status: DC

## 2019-04-05 ENCOUNTER — Encounter: Payer: BC Managed Care – PPO | Admitting: Family Medicine

## 2019-04-05 ENCOUNTER — Other Ambulatory Visit: Payer: Self-pay

## 2019-04-05 ENCOUNTER — Ambulatory Visit (INDEPENDENT_AMBULATORY_CARE_PROVIDER_SITE_OTHER): Payer: BC Managed Care – PPO | Admitting: Certified Nurse Midwife

## 2019-04-05 ENCOUNTER — Encounter: Payer: Self-pay | Admitting: Certified Nurse Midwife

## 2019-04-05 ENCOUNTER — Other Ambulatory Visit: Payer: BC Managed Care – PPO

## 2019-04-05 VITALS — BP 117/62 | HR 110 | Wt 174.0 lb

## 2019-04-05 DIAGNOSIS — Z3480 Encounter for supervision of other normal pregnancy, unspecified trimester: Secondary | ICD-10-CM

## 2019-04-05 DIAGNOSIS — Z23 Encounter for immunization: Secondary | ICD-10-CM

## 2019-04-05 DIAGNOSIS — Z3490 Encounter for supervision of normal pregnancy, unspecified, unspecified trimester: Secondary | ICD-10-CM

## 2019-04-05 LAB — POCT URINALYSIS DIPSTICK OB
Bilirubin, UA: NEGATIVE
Blood, UA: NEGATIVE
Glucose, UA: NEGATIVE
Ketones, UA: NEGATIVE
Leukocytes, UA: NEGATIVE
Nitrite, UA: NEGATIVE
POC,PROTEIN,UA: NEGATIVE
Spec Grav, UA: 1.015 (ref 1.010–1.025)
Urobilinogen, UA: 0.2 E.U./dL
pH, UA: 5 (ref 5.0–8.0)

## 2019-04-05 MED ORDER — TETANUS-DIPHTH-ACELL PERTUSSIS 5-2.5-18.5 LF-MCG/0.5 IM SUSP
0.5000 mL | Freq: Once | INTRAMUSCULAR | Status: AC
  Administered 2019-04-05: 09:00:00 0.5 mL via INTRAMUSCULAR

## 2019-04-05 NOTE — Progress Notes (Signed)
ROB doing well. Feels good movement. 28 wk labs today Tdap/BTC/RPR/CBC/glucose screen. Discussed BC after delivery, will probably continue with Pill. Discussed birth plan. She is still undecided about TOLAC/ repeat c/s. She will see MD next visit for counseling. Will follow up with lab results. Discussed u/s for AFI due to history of oligo with 1st baby. Will do 36 wks.  ROB in 3 wks with MD.   Philip Aspen, CNM

## 2019-04-05 NOTE — Addendum Note (Signed)
Addended by: Raliegh Ip on: 04/05/2019 09:27 AM   Modules accepted: Orders

## 2019-04-05 NOTE — Patient Instructions (Signed)
Td Vaccine (Tetanus and Diphtheria): What You Need to Know 1. Why get vaccinated? Tetanus  and diphtheria are very serious diseases. They are rare in the [place] today, but people who do become infected often have severe complications. Td vaccine is used to protect adolescents and adults from both of these diseases. Both tetanus and diphtheria are infections caused by bacteria. Diphtheria spreads from person to person through coughing or sneezing. Tetanus-causing bacteria enter the body through cuts, scratches, or wounds. TETANUS (Lockjaw) causes painful muscle tightening and stiffness, usually all over the body.  It can lead to tightening of muscles in the head and neck so you can't open your mouth, swallow, or sometimes even breathe. Tetanus kills about 1 out of every 10 people who are infected even after receiving the best medical care. DIPHTHERIA can cause a thick coating to form in the back of the throat.  It can lead to breathing problems, paralysis, heart failure, and death. Before vaccines, as many as 200,000 cases of diphtheria and hundreds of cases of tetanus were reported in the [place] each year. Since vaccination began, reports of cases for both diseases have dropped by about 99%. 2. Td vaccine Td vaccine can protect adolescents and adults from tetanus and diphtheria. Td is usually given as a booster dose every 10 years but it can also be given earlier after a severe and dirty wound or burn. Another vaccine, called Tdap, which protects against pertussis in addition to tetanus and diphtheria, is sometimes recommended instead of Td vaccine. Your doctor or the person giving you the vaccine can give you more information. Td may safely be given at the same time as other vaccines. 3. Some people should not get this vaccine  A person who has ever had a life-threatening allergic reaction after a previous dose of any tetanus or diphtheria containing vaccine, OR has a severe allergy  to any part of this vaccine, should not get Td vaccine. Tell the person giving the vaccine about any severe allergies.  Talk to your doctor if you: ? had severe pain or swelling after any vaccine containing diphtheria or tetanus, ? ever had a condition called Guillain Barr Syndrome (GBS), ? aren't feeling well on the day the shot is scheduled. 4. Risks of a vaccine reaction With any medicine, including vaccines, there is a chance of side effects. These are usually mild and go away on their own. Serious reactions are also possible but are rare. Most people who get Td vaccine do not have any problems with it. Mild Problems following Td vaccine: (Did not interfere with activities)  Pain where the shot was given (about 8 people in 10)  Redness or swelling where the shot was given (about 1 person in 4)  Mild fever (rare)  Headache (about 1 person in 4)  Tiredness (about 1 person in 4) Moderate Problems following Td vaccine: (Interfered with activities, but did not require medical attention)  Fever over 102F (rare) Severe Problems following Td vaccine: (Unable to perform usual activities; required medical attention)  Swelling, severe pain, bleeding and/or redness in the arm where the shot was given (rare). Problems that could happen after any vaccine:  People sometimes faint after a medical procedure, including vaccination. Sitting or lying down for about 15 minutes can help prevent fainting, and injuries caused by a fall. Tell your doctor if you feel dizzy, or have vision changes or ringing in the ears.  Some people get severe pain in the shoulder and have   difficulty moving the arm where a shot was given. This happens very rarely.  Any medication can cause a severe allergic reaction. Such reactions from a vaccine are very rare, estimated at fewer than 1 in a million doses, and would happen within a few minutes to a few hours after the vaccination. As with any medicine, there is a  very remote chance of a vaccine causing a serious injury or death. The safety of vaccines is always being monitored. For more information, visit: www.cdc.gov/vaccinesafety/ 5. What if there is a serious reaction? What should I look for?  Look for anything that concerns you, such as signs of a severe allergic reaction, very high fever, or unusual behavior. Signs of a severe allergic reaction can include hives, swelling of the face and throat, difficulty breathing, a fast heartbeat, dizziness, and weakness. These would usually start a few minutes to a few hours after the vaccination. What should I do?  If you think it is a severe allergic reaction or other emergency that can't wait, call 9-1-1 or get the person to the nearest hospital. Otherwise, call your doctor.  Afterward, the reaction should be reported to the Vaccine Adverse Event Reporting System (VAERS). Your doctor might file this report, or you can do it yourself through the VAERS web site at www.vaers.hhs.gov, or by calling 1-800-822-7967. VAERS does not give medical advice. 6. The National Vaccine Injury Compensation Program The National Vaccine Injury Compensation Program (VICP) is a federal program that was created to compensate people who may have been injured by certain vaccines. Persons who believe they may have been injured by a vaccine can learn about the program and about filing a claim by calling 1-800-338-2382 or visiting the VICP website at www.hrsa.gov/vaccinecompensation. There is a time limit to file a claim for compensation. 7. How can I learn more?  Ask your doctor. He or she can give you the vaccine package insert or suggest other sources of information.  Call your local or state health department.  Contact the Centers for Disease Control and Prevention (CDC): ? Call 1-800-232-4636 (1-800-CDC-INFO) ? Visit CDC's website at www.cdc.gov/vaccines Vaccine Information Statement Td Vaccine (07/25/15) This information is  not intended to replace advice given to you by your health care provider. Make sure you discuss any questions you have with your health care provider. Document Released: 01/27/2006 Document Revised: 11/17/2017 Document Reviewed: 11/17/2017 Elsevier Interactive Patient Education  2020 Elsevier Inc.  

## 2019-04-06 ENCOUNTER — Encounter: Payer: BC Managed Care – PPO | Admitting: Family Medicine

## 2019-04-06 ENCOUNTER — Other Ambulatory Visit: Payer: Self-pay | Admitting: Certified Nurse Midwife

## 2019-04-06 DIAGNOSIS — O99810 Abnormal glucose complicating pregnancy: Secondary | ICD-10-CM

## 2019-04-06 LAB — RPR: RPR Ser Ql: NONREACTIVE

## 2019-04-06 LAB — CBC
Hematocrit: 33 % — ABNORMAL LOW (ref 34.0–46.6)
Hemoglobin: 11.1 g/dL (ref 11.1–15.9)
MCH: 29.8 pg (ref 26.6–33.0)
MCHC: 33.6 g/dL (ref 31.5–35.7)
MCV: 89 fL (ref 79–97)
Platelets: 277 10*3/uL (ref 150–450)
RBC: 3.72 x10E6/uL — ABNORMAL LOW (ref 3.77–5.28)
RDW: 13.1 % (ref 11.7–15.4)
WBC: 9.9 10*3/uL (ref 3.4–10.8)

## 2019-04-06 LAB — GLUCOSE TOLERANCE, 1 HOUR: Glucose, 1Hr PP: 164 mg/dL (ref 65–199)

## 2019-04-06 LAB — ANTIBODY SCREEN: Antibody Screen: NEGATIVE

## 2019-04-06 NOTE — Progress Notes (Signed)
Abnormal 1 hr glucose test, 3hr test ordered.PT notified via my cart.   Philip Aspen, CNM

## 2019-04-13 ENCOUNTER — Other Ambulatory Visit: Payer: Self-pay

## 2019-04-13 ENCOUNTER — Other Ambulatory Visit: Payer: BC Managed Care – PPO

## 2019-04-14 LAB — GESTATIONAL GLUCOSE TOLERANCE
Glucose, Fasting: 81 mg/dL (ref 65–94)
Glucose, GTT - 1 Hour: 148 mg/dL (ref 65–179)
Glucose, GTT - 2 Hour: 123 mg/dL (ref 65–154)
Glucose, GTT - 3 Hour: 127 mg/dL (ref 65–139)

## 2019-04-28 ENCOUNTER — Ambulatory Visit (INDEPENDENT_AMBULATORY_CARE_PROVIDER_SITE_OTHER): Payer: BC Managed Care – PPO | Admitting: Obstetrics and Gynecology

## 2019-04-28 ENCOUNTER — Encounter: Payer: Self-pay | Admitting: Obstetrics and Gynecology

## 2019-04-28 ENCOUNTER — Other Ambulatory Visit: Payer: Self-pay

## 2019-04-28 VITALS — BP 110/76 | HR 112 | Wt 179.4 lb

## 2019-04-28 DIAGNOSIS — Z3480 Encounter for supervision of other normal pregnancy, unspecified trimester: Secondary | ICD-10-CM

## 2019-04-28 DIAGNOSIS — Z98891 History of uterine scar from previous surgery: Secondary | ICD-10-CM

## 2019-04-28 LAB — POCT URINALYSIS DIPSTICK OB
Bilirubin, UA: NEGATIVE
Blood, UA: NEGATIVE
Glucose, UA: NEGATIVE
Ketones, UA: NEGATIVE
Leukocytes, UA: NEGATIVE
Nitrite, UA: NEGATIVE
POC,PROTEIN,UA: NEGATIVE
Spec Grav, UA: 1.01 (ref 1.010–1.025)
Urobilinogen, UA: 0.2 E.U./dL
pH, UA: 6.5 (ref 5.0–8.0)

## 2019-04-28 NOTE — Progress Notes (Signed)
Patient comes in today for ROB visit and consult for possible c section.

## 2019-04-28 NOTE — Progress Notes (Signed)
ROB: Patient considering cesarean versus TOLAC.  Risks and benefits of each discussed.  History of C-section for breech discussed.  All questions answered.  She would like to discuss the matter further with her husband and when they have reached a decision they will inform Amy.  Recommend ultrasound approximately 35-36 weeks for history of oligo and fetal positioning.

## 2019-04-28 NOTE — Addendum Note (Signed)
Addended by: Dorian Pod on: 04/28/2019 04:51 PM   Modules accepted: Orders

## 2019-05-12 ENCOUNTER — Ambulatory Visit (INDEPENDENT_AMBULATORY_CARE_PROVIDER_SITE_OTHER): Payer: BC Managed Care – PPO | Admitting: Certified Nurse Midwife

## 2019-05-12 ENCOUNTER — Other Ambulatory Visit: Payer: Self-pay

## 2019-05-12 VITALS — BP 102/66 | HR 112 | Wt 181.3 lb

## 2019-05-12 DIAGNOSIS — Z0289 Encounter for other administrative examinations: Secondary | ICD-10-CM

## 2019-05-12 DIAGNOSIS — Z3493 Encounter for supervision of normal pregnancy, unspecified, third trimester: Secondary | ICD-10-CM

## 2019-05-12 DIAGNOSIS — Z3A34 34 weeks gestation of pregnancy: Secondary | ICD-10-CM

## 2019-05-12 LAB — POCT URINALYSIS DIPSTICK OB
Bilirubin, UA: NEGATIVE
Blood, UA: NEGATIVE
Glucose, UA: NEGATIVE
Ketones, UA: NEGATIVE
Nitrite, UA: NEGATIVE
Spec Grav, UA: 1.01 (ref 1.010–1.025)
Urobilinogen, UA: 0.2 E.U./dL
pH, UA: 7.5 (ref 5.0–8.0)

## 2019-05-12 NOTE — Patient Instructions (Signed)
Group B Streptococcus Infection During Pregnancy °Group B Streptococcus (GBS) is a type of bacteria that is often found in healthy people. It is commonly found in the rectum, vagina, and intestines. In people who are healthy and not pregnant, the bacteria rarely cause serious illness or complications. However, women who test positive for GBS during pregnancy can pass the bacteria to the baby during childbirth. This can cause serious infection in the baby after birth. °Women with GBS may also have infections during their pregnancy or soon after childbirth. The infections include urinary tract infections (UTIs) or infections of the uterus. GBS also increases a woman's risk of complications during pregnancy, such as early labor or delivery, miscarriage, or stillbirth. Routine testing for GBS is recommended for all pregnant women. °What are the causes? °This condition is caused by bacteria called Streptococcus agalactiae. °What increases the risk? °You may have a higher risk for GBS infection during pregnancy if you had one during a past pregnancy. °What are the signs or symptoms? °In most cases, GBS infection does not cause symptoms in pregnant women. If symptoms exist, they may include: °· Labor that starts before the 37th week of pregnancy. °· A UTI or bladder infection. This may cause a fever, frequent urination, or pain and burning during urination. °· Fever during labor. There can also be a rapid heartbeat in the mother or baby. °Rare but serious symptoms of a GBS infection in women include: °· Blood infection (septicemia). This may cause fever, chills, or confusion. °· Lung infection (pneumonia). This may cause fever, chills, cough, rapid breathing, chest pain, or difficulty breathing. °· Bone, joint, skin, or soft tissue infection. °How is this diagnosed? °You may be screened for GBS between week 35 and week 37 of pregnancy. If you have symptoms of preterm labor, you may be screened earlier. This condition is  diagnosed based on lab test results from: °· A swab of fluid from the vagina and rectum. °· A urine sample. °How is this treated? °This condition is treated with antibiotic medicine. Antibiotic medicine may be given: °· To you when you go into labor, or as soon as your water breaks. The medicines will continue until after you give birth. If you are having a cesarean delivery, you do not need antibiotics unless your water has broken. °· To your baby, if he or she requires treatment. Your health care provider will check your baby to decide if he or she needs antibiotics to prevent a serious infection. °Follow these instructions at home: °· Take over-the-counter and prescription medicines only as told by your health care provider. °· Take your antibiotic medicine as told by your health care provider. Do not stop taking the antibiotic even if you start to feel better. °· Keep all pre-birth (prenatal) visits and follow-up visits as told by your health care provider. This is important. °Contact a health care provider if: °· You have pain or burning when you urinate. °· You have to urinate more often than usual. °· You have a fever or chills. °· You develop a bad-smelling vaginal discharge. °Get help right away if: °· Your water breaks. °· You go into labor. °· You have severe pain in your abdomen. °· You have difficulty breathing. °· You have chest pain. °These symptoms may represent a serious problem that is an emergency. Do not wait to see if the symptoms will go away. Get medical help right away. Call your local emergency services (911 in the [place] Do not drive yourself to   the hospital. °Summary °· GBS is a type of bacteria that is common in healthy people. °· During pregnancy, colonization with GBS can cause serious complications for you or your baby. °· Your health care provider will screen you between 35 and 37 weeks of pregnancy to determine if you are colonized with GBS. °· If you are colonized with GBS during  pregnancy, your health care provider will recommend antibiotics through an IV during labor. °· After delivery, your baby will be evaluated for complications related to potential GBS infection and may require antibiotics to prevent a serious infection. °This information is not intended to replace advice given to you by your health care provider. Make sure you discuss any questions you have with your health care provider. °Document Revised: 10/26/2018 Document Reviewed: 10/26/2018 °Elsevier Patient Education © 2020 Elsevier Inc. ° °

## 2019-05-12 NOTE — Progress Notes (Signed)
ROB doing well. Feels good movement. Reviewed appointment with Dr. Logan Bores, pt has decided that she would like to have a repeat c section. Will see me in 2 wks for rob and gbs testing the do pre op appointment with DR. Evans at 37 wk.  Discussed u/s for afi given pt history of oligo. At next appointment.She verbalizes and agrees to plan of care.   Doreene Burke, CNM

## 2019-05-12 NOTE — Progress Notes (Signed)
ROB-No complaints.  

## 2019-05-21 ENCOUNTER — Telehealth: Payer: Self-pay

## 2019-05-21 NOTE — Telephone Encounter (Signed)
mychart message sent to patient- FMLA papers faxed and confirmation of receipt received.

## 2019-05-26 ENCOUNTER — Other Ambulatory Visit: Payer: Self-pay | Admitting: Certified Nurse Midwife

## 2019-05-26 ENCOUNTER — Other Ambulatory Visit: Payer: Self-pay

## 2019-05-26 ENCOUNTER — Ambulatory Visit (INDEPENDENT_AMBULATORY_CARE_PROVIDER_SITE_OTHER): Payer: BC Managed Care – PPO | Admitting: Certified Nurse Midwife

## 2019-05-26 ENCOUNTER — Encounter: Payer: Self-pay | Admitting: Certified Nurse Midwife

## 2019-05-26 ENCOUNTER — Ambulatory Visit (INDEPENDENT_AMBULATORY_CARE_PROVIDER_SITE_OTHER): Payer: BC Managed Care – PPO

## 2019-05-26 VITALS — BP 127/66 | HR 114 | Wt 184.1 lb

## 2019-05-26 DIAGNOSIS — Z3493 Encounter for supervision of normal pregnancy, unspecified, third trimester: Secondary | ICD-10-CM

## 2019-05-26 LAB — OB RESULTS CONSOLE GC/CHLAMYDIA: Gonorrhea: NEGATIVE

## 2019-05-26 NOTE — Patient Instructions (Signed)
Group B Streptococcus Infection During Pregnancy °Group B Streptococcus (GBS) is a type of bacteria that is often found in healthy people. It is commonly found in the rectum, vagina, and intestines. In people who are healthy and not pregnant, the bacteria rarely cause serious illness or complications. However, women who test positive for GBS during pregnancy can pass the bacteria to the baby during childbirth. This can cause serious infection in the baby after birth. °Women with GBS may also have infections during their pregnancy or soon after childbirth. The infections include urinary tract infections (UTIs) or infections of the uterus. GBS also increases a woman's risk of complications during pregnancy, such as early labor or delivery, miscarriage, or stillbirth. Routine testing for GBS is recommended for all pregnant women. °What are the causes? °This condition is caused by bacteria called Streptococcus agalactiae. °What increases the risk? °You may have a higher risk for GBS infection during pregnancy if you had one during a past pregnancy. °What are the signs or symptoms? °In most cases, GBS infection does not cause symptoms in pregnant women. If symptoms exist, they may include: °· Labor that starts before the 37th week of pregnancy. °· A UTI or bladder infection. This may cause a fever, frequent urination, or pain and burning during urination. °· Fever during labor. There can also be a rapid heartbeat in the mother or baby. °Rare but serious symptoms of a GBS infection in women include: °· Blood infection (septicemia). This may cause fever, chills, or confusion. °· Lung infection (pneumonia). This may cause fever, chills, cough, rapid breathing, chest pain, or difficulty breathing. °· Bone, joint, skin, or soft tissue infection. °How is this diagnosed? °You may be screened for GBS between week 35 and week 37 of pregnancy. If you have symptoms of preterm labor, you may be screened earlier. This condition is  diagnosed based on lab test results from: °· A swab of fluid from the vagina and rectum. °· A urine sample. °How is this treated? °This condition is treated with antibiotic medicine. Antibiotic medicine may be given: °· To you when you go into labor, or as soon as your water breaks. The medicines will continue until after you give birth. If you are having a cesarean delivery, you do not need antibiotics unless your water has broken. °· To your baby, if he or she requires treatment. Your health care provider will check your baby to decide if he or she needs antibiotics to prevent a serious infection. °Follow these instructions at home: °· Take over-the-counter and prescription medicines only as told by your health care provider. °· Take your antibiotic medicine as told by your health care provider. Do not stop taking the antibiotic even if you start to feel better. °· Keep all pre-birth (prenatal) visits and follow-up visits as told by your health care provider. This is important. °Contact a health care provider if: °· You have pain or burning when you urinate. °· You have to urinate more often than usual. °· You have a fever or chills. °· You develop a bad-smelling vaginal discharge. °Get help right away if: °· Your water breaks. °· You go into labor. °· You have severe pain in your abdomen. °· You have difficulty breathing. °· You have chest pain. °These symptoms may represent a serious problem that is an emergency. Do not wait to see if the symptoms will go away. Get medical help right away. Call your local emergency services (911 in the [place] Do not drive yourself to   the hospital. °Summary °· GBS is a type of bacteria that is common in healthy people. °· During pregnancy, colonization with GBS can cause serious complications for you or your baby. °· Your health care provider will screen you between 35 and 37 weeks of pregnancy to determine if you are colonized with GBS. °· If you are colonized with GBS during  pregnancy, your health care provider will recommend antibiotics through an IV during labor. °· After delivery, your baby will be evaluated for complications related to potential GBS infection and may require antibiotics to prevent a serious infection. °This information is not intended to replace advice given to you by your health care provider. Make sure you discuss any questions you have with your health care provider. °Document Revised: 10/26/2018 Document Reviewed: 10/26/2018 °Elsevier Patient Education © 2020 Elsevier Inc. ° °

## 2019-05-26 NOTE — Progress Notes (Signed)
ROB doing well. Feels good movement. U/s today for hx oligo. AFI wnl, ( see below). Gbs and cultures today. SVE: declined Labor precautions reviewed. . Pt plans on repeat c/section. Has pre op appointment next week with Dr. Logan Bores.   Doreene Burke, CNM   Patient Name: Autumn Todd DOB: 1986-03-01 MRN: [id number] ULTRASOUND REPORT  Location: [facility] Date of Service: 05/26/2019   Indications:growth/afi Findings:  Mason Jim intrauterine pregnancy is visualized with FHR at 133 BPM. Biometrics give an (U/S) Gestational age of [redacted]w[redacted]d and an (U/S) EDD of 06/13/2019; this correlates with the clinically established Estimated Date of Delivery: 06/22/19.  Fetal presentation is Cephalic.  Placenta: fundal. Grade: 2 AFI: 12.2 cm  Growth percentile is 54. EFW: 2971 g ( 9oz)  Impression: 1. [redacted]w[redacted]d Viable Singleton Intrauterine pregnancy previously established criteria. 2. Growth is 54 %ile.  AFI is 12.2 cm.   Recommendations: 1.Clinical correlation with the patient's History and Physical Exam.   Jenine  M. Marciano Sequin     RDMS

## 2019-05-28 LAB — GC/CHLAMYDIA PROBE AMP
Chlamydia trachomatis, NAA: NEGATIVE
Neisseria Gonorrhoeae by PCR: NEGATIVE

## 2019-05-28 LAB — STREP GP B NAA: Strep Gp B NAA: POSITIVE — AB

## 2019-06-02 ENCOUNTER — Encounter: Payer: Self-pay | Admitting: Obstetrics and Gynecology

## 2019-06-02 ENCOUNTER — Other Ambulatory Visit: Payer: Self-pay

## 2019-06-02 ENCOUNTER — Ambulatory Visit: Payer: BC Managed Care – PPO | Admitting: Obstetrics and Gynecology

## 2019-06-02 VITALS — BP 129/85 | HR 114 | Wt 186.9 lb

## 2019-06-02 DIAGNOSIS — Z3A37 37 weeks gestation of pregnancy: Secondary | ICD-10-CM

## 2019-06-02 DIAGNOSIS — Z3493 Encounter for supervision of normal pregnancy, unspecified, third trimester: Secondary | ICD-10-CM

## 2019-06-02 LAB — POCT URINALYSIS DIPSTICK OB
Bilirubin, UA: NEGATIVE
Blood, UA: NEGATIVE
Glucose, UA: NEGATIVE
Ketones, UA: NEGATIVE
Leukocytes, UA: NEGATIVE
Nitrite, UA: NEGATIVE
Spec Grav, UA: 1.01 (ref 1.010–1.025)
Urobilinogen, UA: 0.2 E.U./dL
pH, UA: 7 (ref 5.0–8.0)

## 2019-06-02 NOTE — Progress Notes (Signed)
ROB: After a discussion with her husband patient has chosen to have a repeat cesarean delivery.  This was scheduled for March 5.  Patient to have Covid testing on Tuesday or Wednesday before.  I discussed this with her.  I have answered all her questions regarding repeat cesarean delivery.  Nothing to eat or drink the night before.  Patient reports normal daily fetal movement.

## 2019-06-05 ENCOUNTER — Other Ambulatory Visit: Payer: Self-pay

## 2019-06-05 ENCOUNTER — Encounter: Payer: Self-pay | Admitting: Obstetrics and Gynecology

## 2019-06-05 ENCOUNTER — Inpatient Hospital Stay
Admission: EM | Admit: 2019-06-05 | Discharge: 2019-06-08 | DRG: 788 | Disposition: A | Discharge status: Home / Self Care | Payer: BC Managed Care – PPO | Attending: Obstetrics and Gynecology | Admitting: Obstetrics and Gynecology

## 2019-06-05 DIAGNOSIS — Z3A37 37 weeks gestation of pregnancy: Secondary | ICD-10-CM

## 2019-06-05 DIAGNOSIS — O99824 Streptococcus B carrier state complicating childbirth: Secondary | ICD-10-CM | POA: Diagnosis present

## 2019-06-05 DIAGNOSIS — O34211 Maternal care for low transverse scar from previous cesarean delivery: Principal | ICD-10-CM | POA: Diagnosis present

## 2019-06-05 DIAGNOSIS — Z20822 Contact with and (suspected) exposure to covid-19: Secondary | ICD-10-CM | POA: Diagnosis present

## 2019-06-05 NOTE — OB Triage Note (Signed)
Pt is a 33y/o G3P1011 at [redacted]w[redacted]d with c/o leaking of fluid since 12 lunch time. Pt states her panties have been wet most of the day. Pt states saturating a small peri pad.  Pt states +FM. Pt denies CTX and VB. Monitors applied and assessing. Initial FHT 150.

## 2019-06-06 ENCOUNTER — Inpatient Hospital Stay: Payer: BC Managed Care – PPO | Admitting: Anesthesiology

## 2019-06-06 ENCOUNTER — Encounter
Admission: EM | Disposition: A | Discharge status: Home / Self Care | Payer: Self-pay | Source: Home / Self Care | Attending: Obstetrics and Gynecology

## 2019-06-06 ENCOUNTER — Encounter: Payer: Self-pay | Admitting: Obstetrics and Gynecology

## 2019-06-06 DIAGNOSIS — Z20822 Contact with and (suspected) exposure to covid-19: Secondary | ICD-10-CM | POA: Diagnosis present

## 2019-06-06 DIAGNOSIS — O34211 Maternal care for low transverse scar from previous cesarean delivery: Secondary | ICD-10-CM | POA: Diagnosis present

## 2019-06-06 DIAGNOSIS — O34218 Maternal care for other type scar from previous cesarean delivery: Secondary | ICD-10-CM

## 2019-06-06 DIAGNOSIS — O99824 Streptococcus B carrier state complicating childbirth: Secondary | ICD-10-CM | POA: Diagnosis not present

## 2019-06-06 DIAGNOSIS — Z3A37 37 weeks gestation of pregnancy: Secondary | ICD-10-CM

## 2019-06-06 LAB — CBC
HCT: 35.2 % — ABNORMAL LOW (ref 36.0–46.0)
Hemoglobin: 11.4 g/dL — ABNORMAL LOW (ref 12.0–15.0)
MCH: 28.8 pg (ref 26.0–34.0)
MCHC: 32.4 g/dL (ref 30.0–36.0)
MCV: 88.9 fL (ref 80.0–100.0)
Platelets: 252 10*3/uL (ref 150–400)
RBC: 3.96 MIL/uL (ref 3.87–5.11)
RDW: 14.8 % (ref 11.5–15.5)
WBC: 10.5 10*3/uL (ref 4.0–10.5)
nRBC: 0 % (ref 0.0–0.2)

## 2019-06-06 LAB — TYPE AND SCREEN
ABO/RH(D): O POS
Antibody Screen: NEGATIVE

## 2019-06-06 LAB — RUPTURE OF MEMBRANE (ROM)PLUS: Rom Plus: POSITIVE

## 2019-06-06 LAB — RESPIRATORY PANEL BY RT PCR (FLU A&B, COVID)
Influenza A by PCR: NEGATIVE
Influenza B by PCR: NEGATIVE
SARS Coronavirus 2 by RT PCR: NEGATIVE

## 2019-06-06 SURGERY — Surgical Case
Anesthesia: Spinal

## 2019-06-06 MED ORDER — OXYTOCIN 40 UNITS IN NORMAL SALINE INFUSION - SIMPLE MED: 2.5000 [IU]/h | INTRAVENOUS | Status: DC

## 2019-06-06 MED ORDER — LACTATED RINGERS IV SOLN: INTRAVENOUS | Status: DC | PRN

## 2019-06-06 MED ORDER — CEFAZOLIN SODIUM-DEXTROSE 2-4 GM/100ML-% IV SOLN
2.0000 g | INTRAVENOUS | Status: AC
  Administered 2019-06-06: 2 g via INTRAVENOUS
  Filled 2019-06-06: qty 100

## 2019-06-06 MED ORDER — LACTATED RINGERS IV SOLN: 500.0000 mL | INTRAVENOUS | Status: DC | PRN

## 2019-06-06 MED ORDER — MEPERIDINE HCL 25 MG/ML IJ SOLN: 6.2500 mg | INTRAMUSCULAR | Status: DC | PRN

## 2019-06-06 MED ORDER — MENTHOL 3 MG MT LOZG
1.0000 | LOZENGE | OROMUCOSAL | Status: DC | PRN
  Filled 2019-06-06: qty 9

## 2019-06-06 MED ORDER — ONDANSETRON HCL 4 MG/2ML IJ SOLN
4.0000 mg | Freq: Three times a day (TID) | INTRAMUSCULAR | Status: DC | PRN
  Administered 2019-06-06: 4 mg via INTRAVENOUS
  Filled 2019-06-06: qty 2

## 2019-06-06 MED ORDER — DIPHENHYDRAMINE HCL 50 MG/ML IJ SOLN: 12.5000 mg | INTRAMUSCULAR | Status: DC | PRN

## 2019-06-06 MED ORDER — SODIUM CHLORIDE 0.9% FLUSH: 3.0000 mL | INTRAVENOUS | Status: DC | PRN

## 2019-06-06 MED ORDER — DEXAMETHASONE SODIUM PHOSPHATE 10 MG/ML IJ SOLN
INTRAMUSCULAR | Status: DC | PRN
  Administered 2019-06-06: 10 mg via INTRAVENOUS

## 2019-06-06 MED ORDER — NALBUPHINE HCL 10 MG/ML IJ SOLN: 5.0000 mg | INTRAMUSCULAR | Status: DC | PRN

## 2019-06-06 MED ORDER — FENTANYL CITRATE (PF) 100 MCG/2ML IJ SOLN
INTRAMUSCULAR | Status: AC
  Filled 2019-06-06: qty 2

## 2019-06-06 MED ORDER — LACTATED RINGERS IV SOLN: INTRAVENOUS | Status: DC

## 2019-06-06 MED ORDER — NALBUPHINE HCL 10 MG/ML IJ SOLN: 5.0000 mg | Freq: Once | INTRAMUSCULAR | Status: DC | PRN

## 2019-06-06 MED ORDER — NALOXONE HCL 4 MG/10ML IJ SOLN
1.0000 ug/kg/h | INTRAVENOUS | Status: DC | PRN
  Filled 2019-06-06: qty 5

## 2019-06-06 MED ORDER — ZOLPIDEM TARTRATE 5 MG PO TABS: 5.0000 mg | ORAL_TABLET | Freq: Every evening | ORAL | Status: DC | PRN

## 2019-06-06 MED ORDER — OXYCODONE HCL 5 MG PO TABS: 5.0000 mg | ORAL_TABLET | Freq: Once | ORAL | Status: DC | PRN

## 2019-06-06 MED ORDER — OXYTOCIN 40 UNITS IN NORMAL SALINE INFUSION - SIMPLE MED
2.5000 [IU]/h | INTRAVENOUS | Status: AC
  Administered 2019-06-06: 2.5 [IU]/h via INTRAVENOUS
  Filled 2019-06-06: qty 1000

## 2019-06-06 MED ORDER — NALOXONE HCL 0.4 MG/ML IJ SOLN: 0.4000 mg | INTRAMUSCULAR | Status: DC | PRN

## 2019-06-06 MED ORDER — KETOROLAC TROMETHAMINE 30 MG/ML IJ SOLN
30.0000 mg | Freq: Four times a day (QID) | INTRAMUSCULAR | Status: AC
  Administered 2019-06-06 (×4): 30 mg via INTRAVENOUS
  Filled 2019-06-06 (×4): qty 1

## 2019-06-06 MED ORDER — KETOROLAC TROMETHAMINE 30 MG/ML IJ SOLN
INTRAMUSCULAR | Status: DC | PRN
  Administered 2019-06-06: 30 mg via INTRAVENOUS

## 2019-06-06 MED ORDER — LIDOCAINE 5 % EX PTCH
MEDICATED_PATCH | CUTANEOUS | Status: AC
  Filled 2019-06-06: qty 1

## 2019-06-06 MED ORDER — SODIUM CHLORIDE 0.9 % IV SOLN
500.0000 mg | INTRAVENOUS | Status: DC
  Administered 2019-06-06: 500 mg via INTRAVENOUS
  Filled 2019-06-06 (×2): qty 500

## 2019-06-06 MED ORDER — PRENATAL MULTIVITAMIN CH
1.0000 | ORAL_TABLET | Freq: Every day | ORAL | Status: DC
  Administered 2019-06-06 – 2019-06-08 (×3): 1 via ORAL
  Filled 2019-06-06 (×3): qty 1

## 2019-06-06 MED ORDER — OXYCODONE HCL 5 MG PO TABS: 5.0000 mg | ORAL_TABLET | Freq: Four times a day (QID) | ORAL | Status: AC | PRN

## 2019-06-06 MED ORDER — ACETAMINOPHEN 325 MG PO TABS
650.0000 mg | ORAL_TABLET | Freq: Four times a day (QID) | ORAL | Status: AC
  Administered 2019-06-06 – 2019-06-07 (×4): 650 mg via ORAL
  Filled 2019-06-06 (×4): qty 2

## 2019-06-06 MED ORDER — MORPHINE SULFATE (PF) 0.5 MG/ML IJ SOLN
INTRAMUSCULAR | Status: DC | PRN
  Administered 2019-06-06: 100 ug via INTRATHECAL

## 2019-06-06 MED ORDER — SIMETHICONE 80 MG PO CHEW
80.0000 mg | CHEWABLE_TABLET | Freq: Four times a day (QID) | ORAL | Status: DC
  Administered 2019-06-06 – 2019-06-08 (×8): 80 mg via ORAL
  Filled 2019-06-06 (×8): qty 1

## 2019-06-06 MED ORDER — IBUPROFEN 800 MG PO TABS
800.0000 mg | ORAL_TABLET | Freq: Three times a day (TID) | ORAL | Status: DC
  Administered 2019-06-07 – 2019-06-08 (×3): 800 mg via ORAL
  Filled 2019-06-06 (×4): qty 1

## 2019-06-06 MED ORDER — OXYTOCIN 40 UNITS IN NORMAL SALINE INFUSION - SIMPLE MED
INTRAVENOUS | Status: AC
  Filled 2019-06-06: qty 1000

## 2019-06-06 MED ORDER — MORPHINE SULFATE (PF) 0.5 MG/ML IJ SOLN
INTRAMUSCULAR | Status: AC
  Filled 2019-06-06: qty 10

## 2019-06-06 MED ORDER — OXYCODONE HCL 5 MG/5ML PO SOLN: 5.0000 mg | Freq: Once | ORAL | Status: DC | PRN

## 2019-06-06 MED ORDER — BUPIVACAINE IN DEXTROSE 0.75-8.25 % IT SOLN
INTRATHECAL | Status: DC | PRN
  Administered 2019-06-06: 1.5 mL via INTRATHECAL

## 2019-06-06 MED ORDER — OXYTOCIN BOLUS FROM INFUSION: 500.0000 mL | Freq: Once | INTRAVENOUS | Status: DC

## 2019-06-06 MED ORDER — FENTANYL CITRATE (PF) 100 MCG/2ML IJ SOLN
INTRAMUSCULAR | Status: DC | PRN
  Administered 2019-06-06: 15 ug via INTRAVENOUS

## 2019-06-06 MED ORDER — KETOROLAC TROMETHAMINE 30 MG/ML IJ SOLN: 30.0000 mg | Freq: Four times a day (QID) | INTRAMUSCULAR | Status: AC

## 2019-06-06 MED ORDER — SOD CITRATE-CITRIC ACID 500-334 MG/5ML PO SOLN
ORAL | Status: AC
  Administered 2019-06-06: 30 mL
  Filled 2019-06-06: qty 30

## 2019-06-06 MED ORDER — DIPHENHYDRAMINE HCL 25 MG PO CAPS: 25.0000 mg | ORAL_CAPSULE | Freq: Four times a day (QID) | ORAL | Status: DC | PRN

## 2019-06-06 MED ORDER — ONDANSETRON HCL 4 MG/2ML IJ SOLN
INTRAMUSCULAR | Status: DC | PRN
  Administered 2019-06-06: 4 mg via INTRAVENOUS

## 2019-06-06 MED ORDER — DIPHENHYDRAMINE HCL 25 MG PO CAPS: 25.0000 mg | ORAL_CAPSULE | ORAL | Status: DC | PRN

## 2019-06-06 MED ORDER — SENNOSIDES-DOCUSATE SODIUM 8.6-50 MG PO TABS
2.0000 | ORAL_TABLET | ORAL | Status: DC
  Administered 2019-06-07 – 2019-06-08 (×2): 2 via ORAL
  Filled 2019-06-06 (×2): qty 2

## 2019-06-06 MED ORDER — OXYCODONE-ACETAMINOPHEN 5-325 MG PO TABS
1.0000 | ORAL_TABLET | ORAL | Status: DC | PRN
  Administered 2019-06-07: 2 via ORAL
  Administered 2019-06-07: 1 via ORAL
  Administered 2019-06-07 – 2019-06-08 (×4): 2 via ORAL
  Filled 2019-06-06 (×4): qty 2
  Filled 2019-06-06: qty 1
  Filled 2019-06-06: qty 2

## 2019-06-06 MED ORDER — FENTANYL CITRATE (PF) 100 MCG/2ML IJ SOLN: 25.0000 ug | INTRAMUSCULAR | Status: DC | PRN

## 2019-06-06 MED ORDER — CEFAZOLIN SODIUM-DEXTROSE 2-3 GM-%(50ML) IV SOLR
INTRAVENOUS | Status: DC | PRN
  Administered 2019-06-06: 2 g via INTRAVENOUS

## 2019-06-06 MED ORDER — PHENYLEPHRINE HCL-NACL 10-0.9 MG/250ML-% IV SOLN
INTRAVENOUS | Status: DC | PRN
  Administered 2019-06-06: 50 ug/min via INTRAVENOUS

## 2019-06-06 SURGICAL SUPPLY — 26 items
ADHESIVE MASTISOL STRL (MISCELLANEOUS) ×3 IMPLANT
BAG COUNTER SPONGE EZ (MISCELLANEOUS) ×2 IMPLANT
CANISTER SUCT 3000ML PPV (MISCELLANEOUS) ×3 IMPLANT
CHLORAPREP W/TINT 26 (MISCELLANEOUS) ×6 IMPLANT
COUNTER SPONGE BAG EZ (MISCELLANEOUS) ×1
COVER WAND RF STERILE (DRAPES) ×3 IMPLANT
DRSG TELFA 3X8 NADH (GAUZE/BANDAGES/DRESSINGS) ×3 IMPLANT
GAUZE SPONGE 4X4 12PLY STRL (GAUZE/BANDAGES/DRESSINGS) ×3 IMPLANT
GLOVE BIOGEL PI ORTHO PRO 7.5 (GLOVE) ×2
GLOVE PI ORTHO PRO STRL 7.5 (GLOVE) ×1 IMPLANT
GOWN STRL REUS W/ TWL LRG LVL3 (GOWN DISPOSABLE) ×2 IMPLANT
GOWN STRL REUS W/TWL LRG LVL3 (GOWN DISPOSABLE) ×4
KIT TURNOVER KIT A (KITS) ×3 IMPLANT
NS IRRIG 1000ML POUR BTL (IV SOLUTION) ×3 IMPLANT
PACK C SECTION AR (MISCELLANEOUS) ×3 IMPLANT
PAD OB MATERNITY 4.3X12.25 (PERSONAL CARE ITEMS) ×3 IMPLANT
PAD PREP 24X41 OB/GYN DISP (PERSONAL CARE ITEMS) ×3 IMPLANT
PENCIL SMOKE ULTRAEVAC 22 CON (MISCELLANEOUS) ×3 IMPLANT
RETRACTOR WND ALEXIS-O 25 LRG (MISCELLANEOUS) ×1 IMPLANT
RTRCTR C-SECT PINK 25CM LRG (MISCELLANEOUS) ×3 IMPLANT
RTRCTR WOUND ALEXIS O 25CM LRG (MISCELLANEOUS) ×3
SPONGE LAP 18X18 RF (DISPOSABLE) ×3 IMPLANT
SUT VIC AB 0 CTX 36 (SUTURE) ×4
SUT VIC AB 0 CTX36XBRD ANBCTRL (SUTURE) ×2 IMPLANT
SUT VIC AB 1 CT1 36 (SUTURE) ×6 IMPLANT
SUT VICRYL+ 3-0 36IN CT-1 (SUTURE) ×6 IMPLANT

## 2019-06-06 NOTE — Anesthesia Preprocedure Evaluation (Addendum)
Anesthesia Evaluation  Patient identified by MRN, date of birth, ID band Patient awake    Reviewed: Allergy & Precautions, H&P , NPO status , Patient's Chart, lab work & pertinent test results  Airway Mallampati: III  TM Distance: >3 FB Neck ROM: full    Dental  (+) Chipped   Pulmonary asthma ,           Cardiovascular Exercise Tolerance: Good (-) hypertension(-) angina(-) Past MI and (-) DOE + Valvular Problems/Murmurs      Neuro/Psych PSYCHIATRIC DISORDERS Anxiety    GI/Hepatic negative GI ROS,   Endo/Other    Renal/GU   negative genitourinary   Musculoskeletal   Abdominal   Peds  Hematology negative hematology ROS (+)   Anesthesia Other Findings Past Medical History: No date: Allergy 07/24/2015: Anemia 03/11/2018: Anxiety No date: Asthma     Comment:  not in four years No date: Sinus tachycardia  Past Surgical History: 04/27/2015: CESAREAN SECTION; N/A     Comment:  Procedure: CESAREAN SECTION;  Surgeon: [place] Bing,               MD;  Location: [facility] ORS;  Service: Obstetrics;                Laterality: N/A; No date: WISDOM TOOTH EXTRACTION  BMI    Body Mass Index: 36.33 kg/m      Reproductive/Obstetrics (+) Pregnancy                           Anesthesia Physical Anesthesia Plan  ASA: III and emergent  Anesthesia Plan: Spinal   Post-op Pain Management:    Induction:   PONV Risk Score and Plan:   Airway Management Planned: Natural Airway and Nasal Cannula  Additional Equipment:   Intra-op Plan:   Post-operative Plan:   Informed Consent: I have reviewed the patients History and Physical, chart, labs and discussed the procedure including the risks, benefits and alternatives for the proposed anesthesia with the patient or authorized representative who has indicated his/her understanding and acceptance.     Dental Advisory Given  Plan Discussed with:  Anesthesiologist, CRNA and Surgeon  Anesthesia Plan Comments: (Patient reports no bleeding problems and no anticoagulant use.  Plan for spinal with backup GA  Patient consented for risks of anesthesia including but not limited to:  - adverse reactions to medications - risk of bleeding, infection, nerve damage and headache - risk of failed spinal - damage to teeth, lips or other oral mucosa - sore throat or hoarseness - Damage to heart, brain, lungs or loss of life  Patient voiced understanding.)        Anesthesia Quick Evaluation

## 2019-06-06 NOTE — Transfer of Care (Signed)
Immediate Anesthesia Transfer of Care Note  Patient: Autumn Todd  Procedure(s) Performed: CESAREAN SECTION (N/A )  Patient Location: PACU  Anesthesia Type:Epidural  Level of Consciousness: sedated  Airway & Oxygen Therapy: Patient Spontanous Breathing  Post-op Assessment: Report given to RN and Post -op Vital signs reviewed and stable  Post vital signs: Reviewed and stable  Last Vitals:  Vitals Value Taken Time  BP 144/117 06/06/19 0419  Temp    Pulse 113 06/06/19 0427  Resp 18 06/06/19 0427  SpO2 99 % 06/06/19 0427  Vitals shown include unvalidated device data.  Last Pain:  Vitals:   06/05/19 2359  TempSrc: Oral  PainSc:          Complications: No apparent anesthesia complications

## 2019-06-06 NOTE — Op Note (Signed)
      OP NOTE  Date: 06/06/2019   4:13 AM Name Autumn Todd MR# [id number]  Preoperative Diagnosis: 1. Intrauterine pregnancy at [redacted]w[redacted]d Active Problems:   Labor and delivery, indication for care  2.  Pt desires repeat.  3.  SROM  Postoperative Diagnosis: 1. Intrauterine pregnancy at [redacted]w[redacted]d, delivered 2. Viable infant 3. Remainder same as pre-op   Procedure: 1. Repeat Low-Transverse Cesarean Section  Surgeon: Elonda Husky, MD  Assistant:  Thompson CNM  No other capable assistant available for this surgery which requires an experienced, high level assistant.  Anesthesia: Spinal    EBL: 520 ml     Findings: 1) female infant, Apgar scores of 9   at 1 minute and 9   at 5 minutes and a birthweight of 113.93  ounces.    2) Normal uterus, tubes and ovaries.    Procedure:  The patient was prepped and draped in the supine position and placed under spinal anesthesia.  A transverse incision was made across the abdomen in a Pfannenstiel manner. If indicated the old scar was systematically removed with sharp dissection.  We carried the dissection down to the level of the fascia.  The fascia was incised in a curvilinear manner.  The fascia was then elevated from the rectus muscles with blunt and sharp dissection.  The rectus muscles were separated laterally exposing the peritoneum.  The peritoneum was carefully entered with care being taken to avoid bowel and bladder.  A Todd-retaining retractor was placed.  The visceral peritoneum was incised in a curvilinear fashion across the lower uterine segment creating a bladder flap. A transverse incision was made across the lower uterine segment and extended laterally and superiorly using the bandage scissors.  Artificial rupture membranes was performed and Clear fluid was noted.  The infant was delivered from the cephalic position.  A nuchal cord was not present. After an appropriate time interval, the cord was doubly clamped and cut. Cord blood  was obtained if required.  The infant was handed to the pediatric personnel  who then placed the infant under heat lamps where it was cleaned dried and suctioned as needed. The placenta was delivered. The hysterotomy incision was then identified on ring forceps.  The uterine cavity was cleaned with a moist lap sponge.  The hysterotomy incision was closed with a running interlocking suture of Vicryl.  Hemostasis was excellent.  Pitocin was run in the IV and the uterus was found to be firm. The posterior cul-de-sac and gutters were cleaned and inspected.  Hemostasis was noted.  The fascia was then closed with a running suture of #1 Vicryl.  Hemostasis of the subcutaneous tissues was obtained using the Bovie.  The subcutaneous tissues were closed with a running suture of 000 Vicryl.  A subcuticular suture was placed.  Steri-Strips were applied in the usual manner.  A pressure dressing was placed.  The patient went to the recovery room in stable condition.   Elonda Husky, M.D. 06/06/2019 4:13 AM

## 2019-06-06 NOTE — Lactation Note (Signed)
This note was copied from a baby's chart. Lactation Consultation Note  Patient Name: Autumn Todd [id number] Date: 06/06/2019 Reason for consult: Follow-up assessment;Mother's request;Difficult latch;Early term 37-38.6wks;Other (Comment)(Mom had C/S and hx anxiety on Buspar)  Mom has struggled to [employer] without assistance.  Mom has flat nipples with left slightly inverting with compression.  Mom has red area on top of right nipple where Eulas Post had probably latched on incorrectly earlier this am.  Mom reports Eulas Post latched not too long after delivery, but that was the last time he latched.  Mom introduced pacifier after that feeding.  Attempted latching him to left breast without success.  He could not sustain latch.  When introduced nipple shield initially he would push it out with his tongue.  After several attempts he could latch and maintain the latch.  After a few minutes, he started sucking vigorously with mom feeling strong tug at the breast.  He continued sucking for 30 minutes.  When he came off the breast, colostrum was in the nipple shield and he appeared satiated.  Left nipple was slightly more everted than before using nipple shield.  For next feeding on right breast, we still could not get him to latch without nipple shield.  Even with nipple shield it took several attempts to get him latched and a few minutes of massaging breast to get milk in nipple shield and stimulation of chin before he started sucking.  He remained sucking with nipple shield on right breast for 20 minutes before falling asleep.  There was milk in nipple shield after feeding and once again he appeared satisfied.  Mom's nipple was significantly more everted after breast feed than before.  Mom denies any breast pain or nipple discomfort.  Mom has her own Lansinoh nipple ointment, but does not need to use a present.  Mom reports attempting to breast feed her first baby without being able to get her to latch. She gave  up after a couple of days of trying and pumped for 2 months.  She reports not being able to keep up supply once she started back to work.  Pointed out feeding cues to parents and encouraged mom to put Eulas Post to the breast whenever he demonstrated hunger cues.  Mom already has Medela DEBP through her [employer].  DEBP kit given per mom's request.  Have not needed to pump yet to help evert nipples since we have been able to get him on with nipple shield and nipples have been more everted after breast feeding.  Reviewed normal newborn stomach size, supply and demand, how to know Eulas Post is getting colostrum, normal course of lactation and routine newborn feeding patterns.  Lactation name and number written on white board and encouraged to call with any questions, concerns or assistance. Maternal Data Formula Feeding for Exclusion: No Has patient been taught Hand Expression?: Yes Does the patient have breastfeeding experience prior to this delivery?: Yes  Feeding Feeding Type: Breast Fed  LATCH Score Latch: Repeated attempts needed to sustain latch, nipple held in mouth throughout feeding, stimulation needed to elicit sucking reflex.  Audible Swallowing: A few with stimulation  Type of Nipple: Flat  Comfort (Breast/Nipple): Soft / non-tender  Hold (Positioning): Assistance needed to correctly position infant at breast and maintain latch.  LATCH Score: 6  Interventions Interventions: Breast feeding basics reviewed;Assisted with latch;Skin to skin;Breast massage;Hand express;Reverse pressure;Breast compression;Adjust position;Support pillows;Position options  Lactation Tools Discussed/Used Tools: Nipple Shields Nipple shield size: 20 WIC Program: No(BCBS)  Pump Review: Setup, frequency, and cleaning;Milk Storage;Other (comment)   Consult Status Consult Status: Follow-up Date: 06/06/19 Follow-up type: Call as needed    Jarold Motto 06/06/2019, 6:16 PM

## 2019-06-06 NOTE — H&P (Signed)
History and Physical   HPI  Autumn Todd is a 34 y.o. G3P1011 at [redacted]w[redacted]d Estimated Date of Delivery: 06/22/19 who is being admitted for SROM, planned repeat cesarean section.  MOB History  OB History  Gravida Para Term Preterm AB Living  3 1 1  0 1 1  SAB TAB Ectopic Multiple Live Births  1 0 0 0 1    # Outcome Date GA Lbr Len/2nd Weight Sex Delivery Anes PTL Lv  3 Current           2 SAB 2020 [redacted]w[redacted]d         1 Term 04/27/15 [redacted]w[redacted]d  2740 g F CS-LTranv Spinal, EPI  LIV     Name: Autumn Todd     Apgar1: 8  Apgar5: 9    PROBLEM LIST  Pregnancy complications or risks: Patient Active Problem List   Diagnosis Date Noted  . History of oligohydramnios in prior pregnancy, currently pregnant 02/11/2019  . Previous cesarean section 02/11/2019  . Maternal varicella, non-immune 02/11/2019  . Declines vaginal birth after cesarean trial 02/11/2019  . Anxiety during pregnancy 12/23/2018  . Environmental allergies 09/29/2018  . Anxiety 03/11/2018  . Aortic valve regurgitation 10/01/2017  . Mitral regurgitation 10/01/2017  . 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 De Quervain's tenosynovitis, left 06/06/2015  . Eczema of right hand 06/06/2015  . Asthma, mild intermittent 10/11/2014    Prenatal labs and studies: ABO, Rh: --/--/O POS (02/21 0057) Antibody: NEG (02/21 0057) Rubella: 2.73 (08/10 1043) RPR: Non Reactive (12/21 0937)  HBsAg: Negative (08/10 1043)  HIV: Non Reactive (08/10 1043)  GBS:--/Positive (02/10 1622)   Past Medical History:  Diagnosis Date  . Allergy   . Anemia 07/24/2015  . Anxiety 03/11/2018  . Asthma    not in four years  . Sinus tachycardia      Past Surgical History:  Procedure Laterality Date  . CESAREAN SECTION N/A 04/27/2015   Procedure: CESAREAN SECTION;  Surgeon: 06/25/2015, MD;  Location: [facility] ORS;  Service: Obstetrics;  Laterality: N/A;  . WISDOM TOOTH EXTRACTION       Medications      Current Discharge Medication List    CONTINUE these medications which have NOT CHANGED   Details  busPIRone (BUSPAR) 7.5 MG tablet TAKE 1 TABLET(7.5 MG) BY MOUTH TWICE DAILY Qty: 60 tablet, Refills: 1   Associated Diagnoses: Anxiety during pregnancy    famotidine (PEPCID) 20 MG tablet Take 20 mg by mouth 2 (two) times daily.    levocetirizine (XYZAL) 5 MG tablet     loratadine (CLARITIN) 10 MG tablet Take 1 tablet (10 mg total) by mouth daily. Qty: 90 tablet, Refills: 1   Comments: Replace Xyzal Associated Diagnoses: Environmental allergies    Prenatal Vit-Fe Fumarate-FA (PRENATAL MULTIVITAMIN) TABS tablet Take 1 tablet by mouth daily at 12 noon.         Allergies  Patient has no known allergies.  Review of Systems  Constitutional: negative Eyes: negative Ears, nose, mouth, throat, and face: negative Respiratory: negative Cardiovascular: negative Gastrointestinal: negative Genitourinary:negative Integument/breast: negative Hematologic/lymphatic: negative Musculoskeletal:negative Neurological: negative Behavioral/Psych: negative Endocrine: negative Allergic/Immunologic: negative  Physical Exam  BP 137/70 (BP Location: Right Arm)   Pulse (!) 129   Temp 98.2 F (36.8 C) (Oral)   Resp 18   Ht 5' (1.524 m)   Wt 84.4 kg   LMP 09/19/2018 (Exact Date)   BMI 36.33 kg/m   Lungs:  CTA B Cardio: RRR  Abd: Soft, gravid, NT Presentation: cephalic EXT: No C/C/ 1+ Edema DTRs: 2+ B CERVIX:  per RN exam   See Prenatal records for more detailed PE.     FHR:  Baseline: 140 bpm, Variability: Good {> 6 bpm), Accelerations: Reactive and Decelerations: prlonged decel x 1 , on her back with IV start  Toco: Uterine Contractions: irregular  Test Results  Results for orders placed or performed during the hospital encounter of 06/05/19 (from the past 24 hour(s))  ROM Plus ([facility] only)     Status: None   Collection Time: 06/06/19 12:02 AM  Result Value Ref Range    Rom Plus POSITIVE   Respiratory Panel by RT PCR (Flu A&B, Covid) - Nasopharyngeal Swab     Status: None   Collection Time: 06/06/19 12:02 AM   Specimen: Nasopharyngeal Swab  Result Value Ref Range   SARS Coronavirus 2 by RT PCR NEGATIVE NEGATIVE   Influenza A by PCR NEGATIVE NEGATIVE   Influenza B by PCR NEGATIVE NEGATIVE  CBC     Status: Abnormal   Collection Time: 06/06/19 12:57 AM  Result Value Ref Range   WBC 10.5 4.0 - 10.5 K/uL   RBC 3.96 3.87 - 5.11 MIL/uL   Hemoglobin 11.4 (L) 12.0 - 15.0 g/dL   HCT 35.2 (L) 36.0 - 46.0 %   MCV 88.9 80.0 - 100.0 fL   MCH 28.8 26.0 - 34.0 pg   MCHC 32.4 30.0 - 36.0 g/dL   RDW 14.8 11.5 - 15.5 %   Platelets 252 150 - 400 K/uL   nRBC 0.0 0.0 - 0.2 %  Type and screen     Status: None   Collection Time: 06/06/19 12:57 AM  Result Value Ref Range   ABO/RH(D) O POS    Antibody Screen NEG    Sample Expiration      06/09/2019,2359 Performed at [facility], [address] [place], [place] [address]    Group B Strep positive    Assessment   G3P1011 at [redacted]w[redacted]d Estimated Date of Delivery: 06/22/19  The fetus is reassuring.   Patient Active Problem List   Diagnosis Date Noted  . History of oligohydramnios in prior pregnancy, currently pregnant 02/11/2019  . Previous cesarean section 02/11/2019  . Maternal varicella, non-immune 02/11/2019  . Declines vaginal birth after cesarean trial 02/11/2019  . Anxiety during pregnancy 12/23/2018  . Environmental allergies 09/29/2018  . Anxiety 03/11/2018  . Aortic valve regurgitation 10/01/2017  . Mitral regurgitation 10/01/2017  . 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 De Quervain's tenosynovitis, left 06/06/2015  . Eczema of right hand 06/06/2015  . Asthma, mild intermittent 10/11/2014    Plan  1. Admit to L&D :   2. EFM:-- Category 1 3. Plan for cesarean section  4. Admission labs    Autumn Todd, [place]  06/06/2019 2:05 AM

## 2019-06-07 NOTE — Lactation Note (Signed)
This note was copied from a baby's chart. 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  Attempted a couple of times when changing diaper and he would cry but as soon as we would get him to the breast he would go back to sleep and would not open his mouth to latch with or without nipple shield.  After it had been over 6 hours since circumcision and he was still refusing to latch with or without nipple shield. LC discussed other options for mom.  Mom had already tried to pump and hand express without success and nipples were starting to get tender.  Mom agreed to supplement with DBM at the breast.  Dropped a few drops of DBM on his tongue and then put some in tip of #20 nipple shield.  After several attempts, he latched to nipple shield sucked a few times swallowing what was in nipple shield and then started tongue thrusting nipple shield out of his mouth.  Finally got him latched again, but he would not suck.  Gave him some more of DBM via curved tip syringe along side of the nipple shield.  He finally started rhythmically sucking for about 10 minutes and took in a total of 8 ml of DBM and whatever we could hand express into the nipple shield from mom.  He started to arouse again about 3 hours later and latched with minimal assistance on the nipple shield and sucked with frequent stimulation of chin and massaging of mom's breast.  After 32 minutes, Carter started to slip off to the tip of the nipple getting a shallow latch and poor seal on the breast causing mom nipple pain.  Broke suction and he appeared satiated.  Mom reports feeling some pinching at the end of the feeding and wondered if a larger nipple shield might be better.  #24 nipple  shield given to try at next feeding.  Coconut oil and comfort gels have been given and instructed in rotating use.  FOB very involved and supportive of breast feeding.  Praised mom for her commitment to continue to provide the best nutrition for [employer].  Lactation community resource hand out with contact numbers given and reviewed encouraging mom to call even after discharge with any questions, concerns or assistance. Maternal Data Formula Feeding for Exclusion: No Has patient been taught Hand Expression?: Yes Does the patient have breastfeeding experience prior to this delivery?: Yes(Attempted with first for couple of days)  Feeding Feeding Type: Breast Fed  LATCH Score Latch: Grasps breast easily, tongue down, lips flanged, rhythmical sucking.  Audible Swallowing: A few with stimulation  Type of Nipple: Flat  Comfort (Breast/Nipple): Filling, red/small blisters or bruises, mild/mod discomfort  Hold (Positioning): Assistance needed to correctly position infant at breast and maintain latch.  LATCH Score: 6  Interventions Interventions: Assisted with latch;Breast massage;Hand express;Reverse pressure;Breast compression;Adjust position;Support pillows;Position options;Coconut oil;Comfort gels  Lactation Tools Discussed/Used Tools: Pump;Coconut oil;Comfort gels;Nipple Shields Nipple shield size: 20(Gave mom #24 nipple shield as well) Breast pump type: Double-Electric Breast Pump WIC Program: No(BCBS Insurance) Pump Review: Setup, frequency, and cleaning;Milk Storage;Other (comment) Initiated by:: S.Tien Spooner, RN, BSN, IBCLC Date initiated:: 06/07/19   Consult Status Consult Status: Follow-up Follow-up type: Call as needed    Louis Meckel 06/07/2019, 9:37 PM

## 2019-06-07 NOTE — Anesthesia Postprocedure Evaluation (Signed)
Anesthesia Post Note  Patient: Autumn Todd  Procedure(s) Performed: CESAREAN SECTION (N/A )  Patient location during evaluation: Mother Baby Anesthesia Type: Spinal Level of consciousness: awake and alert and oriented Pain management: pain level controlled Vital Signs Assessment: post-procedure vital signs reviewed and stable Respiratory status: spontaneous breathing Cardiovascular status: stable Postop Assessment: no headache, no backache, no apparent nausea or vomiting, patient able to bend at knees, adequate PO intake and able to ambulate Anesthetic complications: no     Last Vitals:  Vitals:   06/06/19 1945 06/06/19 2300  BP: 115/68 100/63  Pulse:  96  Resp: 18 18  Temp: 36.9 C 36.6 C  SpO2:  98%    Last Pain:  Vitals:   06/07/19 0523  TempSrc:   PainSc: 0-No pain                 Zachary George

## 2019-06-07 NOTE — Anesthesia Postprocedure Evaluation (Signed)
Anesthesia Post Note  Patient: Autumn Todd  Procedure(s) Performed: CESAREAN SECTION (N/A )  Anesthesia Type: Spinal     Last Vitals:  Vitals:   06/06/19 1945 06/06/19 2300  BP: 115/68 100/63  Pulse:  96  Resp: 18 18  Temp: 36.9 C 36.6 C  SpO2:  98%    Last Pain:  Vitals:   06/07/19 0523  TempSrc:   PainSc: 0-No pain                 Zachary George

## 2019-06-07 NOTE — Progress Notes (Signed)
Patient ID: Autumn Todd, female   DOB: 26-Mar-1986, 34 y.o.   MRN: [id number]     Progress Note - Cesarean Delivery  Autumn Todd is a 34 y.o. G3P2012 now PP day 1 s/p C-Section, Low Transverse .   Subjective:  Patient reports no problems with eating, bowel movements, voiding, or their wound  Pain controlled.  Pt reports minimal bleeding.  Breastfeeding.  Objective:  Vital signs in last 24 hours: Temp:  [97.8 F (36.6 C)-98.4 F (36.9 C)] 98.2 F (36.8 C) (02/22 0820) Pulse Rate:  [93-112] 103 (02/22 0820) Resp:  [18] 18 (02/22 0820) BP: (100-115)/(59-69) 105/59 (02/22 0820) SpO2:  [95 %-99 %] 99 % (02/22 0820)  Physical Exam:  General: alert, cooperative and no distress Lochia: appropriate Uterine Fundus: firm Incision: Dressing dry intact    Data Review Recent Labs    06/06/19 0057  HGB 11.4*  HCT 35.2*    Assessment:  Active Problems:   Labor and delivery, indication for care   Status post Cesarean section. Doing well postoperatively.   Plan:       Continue current care.  Probable discharge tomorrow.  Elonda Husky, M.D. 06/07/2019 10:45 AM

## 2019-06-07 NOTE — Anesthesia Post-op Follow-up Note (Signed)
  Anesthesia Pain Follow-up Note  Patient: Autumn Todd  Day #: 1  Date of Follow-up: 06/07/2019 Time: 7:29 AM  Last Vitals:  Vitals:   06/06/19 1945 06/06/19 2300  BP: 115/68 100/63  Pulse:  96  Resp: 18 18  Temp: 36.9 C 36.6 C  SpO2:  98%    Level of Consciousness: alert  Pain: mild   Side Effects:None  Catheter Site Exam:clean, dry, no drainage     Plan: Continue current therapy of postop epidural at surgeon's request  Zachary George

## 2019-06-08 MED ORDER — IBUPROFEN 800 MG PO TABS: 800.0000 mg | ORAL_TABLET | Freq: Three times a day (TID) | ORAL | 0 refills | Status: DC

## 2019-06-08 MED ORDER — OXYCODONE-ACETAMINOPHEN 5-325 MG PO TABS: 1.0000 | ORAL_TABLET | ORAL | 0 refills | Status: DC | PRN

## 2019-06-08 MED ORDER — SIMETHICONE 80 MG PO CHEW: 80.0000 mg | CHEWABLE_TABLET | Freq: Four times a day (QID) | ORAL | 0 refills | Status: DC

## 2019-06-08 MED ORDER — SENNOSIDES-DOCUSATE SODIUM 8.6-50 MG PO TABS: 2.0000 | ORAL_TABLET | ORAL | 0 refills | Status: DC

## 2019-06-08 NOTE — Plan of Care (Signed)
Vs stable; up ad lib; tolerating regular diet; taking motrin and percocet for pain control; breastfeeding and does need some assistance; RN did assist with breastfeeding this shift; pt uses size 24 nipple shield

## 2019-06-08 NOTE — Progress Notes (Signed)
Patient discharged home with infant. Discharge instructions, prescriptions and follow up appointment given to and reviewed with patient. Patient verbalized understanding. Pt wheeled out with infant by auxiliary.  

## 2019-06-08 NOTE — Discharge Instructions (Addendum)
Breastfeeding Tips for a Good Latch Latching is how your baby's mouth attaches to your nipple to breastfeed. It is an important part of breastfeeding. Your baby may have trouble latching for a number of reasons. A poor latch may cause you to have cracked or sore nipples or other problems. Follow these instructions at home: How to position your baby  Find a comfortable place to sit or lie down. Your neck and back should be well supported.  If you are seated, place a pillow or rolled-up blanket under your baby. This will bring him or her to the level of your breast.  Make sure that your baby's belly (abdomen) is facing your belly.  Try different positions to find one that works best for you and your baby. How to help your baby latch   To start, gently rub your breast. Move your fingertips in a circle as you massage from your chest wall toward your nipple. This helps milk flow. Keep doing this during feeding if needed.  Position your breast. Hold your breast with four fingers underneath and your thumb above your nipple. Keep your fingers away from your nipple and your baby's mouth. Follow these steps to help your baby latch: 1. Rub your baby's lips gently with your finger or nipple. 2. When your baby's mouth is open wide enough, quickly bring your baby to your breast and place your whole nipple into your baby's mouth. Place as much of the colored area around your nipple (areola)as possible into your baby's mouth. 3. Your baby's tongue should be between his or her lower gum and your breast. 4. You should be able to see more areola above your baby's upper lip than below the lower lip. 5. When your baby starts sucking, you will feel a gentle pull on your nipple. You should not feel any pain. Be patient. It is common for a baby to suck for about 2-3 minutes to start the flow of breast milk. 6. Make sure that your baby's mouth is in the right position around your nipple. Your baby's lips should make  a seal on your breast and be turned outward.  General instructions  Look for these signs that your baby has latched on to your nipple: ? The baby is quietly tugging or sucking without causing you pain. ? You hear the baby swallow after every 3 or 4 sucks. ? You see movement above and in front of the baby's ears while he or she is sucking.  Be aware of these signs that your baby has not latched on to your nipple: ? The baby makes sucking sounds or smacking sounds while feeding. ? You have nipple pain.  If your baby is not latched well, put your little finger between your baby's gums and your nipple. This will break the seal. Then try to help your baby latch again.  If you keep having problems, get help from a breastfeeding specialist (lactation consultant). Contact a doctor if:  You have cracking or soreness in your nipples that lasts longer than 1 week.  You have nipple pain.  Your breasts are filled with too much milk (engorgement), and this does not improve after 48-72 hours.  You have a plugged milk duct and a fever.  You follow the tips for a good latch but you keep having problems or concerns.  You have a pus-like fluid coming from your breast.  Your baby is not gaining weight.  Your baby loses weight. Summary  Latching is how your   baby's mouth attaches to your nipple to breastfeed.  Try different positions for breastfeeding to find one that works best for you and your baby.  A poor latch may cause you to have cracked or sore nipples or other problems. This information is not intended to replace advice given to you by your health care provider. Make sure you discuss any questions you have with your health care provider. Document Revised: 07/22/2018 Document Reviewed: 11/06/2016 Elsevier Patient Education  2020 [employer]   Postpartum Care After Cesarean Delivery This sheet gives you information about how to care for yourself from the time you deliver your baby  to up to 6-12 weeks after delivery (postpartum period). Your health care provider may also give you more specific instructions. If you have problems or questions, contact your health care provider. Follow these instructions at home: Medicines  Take over-the-counter and prescription medicines only as told by your health care provider.  If you were prescribed an antibiotic medicine, take it as told by your health care provider. Do not stop taking the antibiotic even if you start to feel better.  Ask your health care provider if the medicine prescribed to you: ? Requires you to avoid driving or using heavy machinery. ? Can cause constipation. You may need to take actions to prevent or treat constipation, such as:  Drink enough fluid to keep your urine pale yellow.  Take over-the-counter or prescription medicines.  Eat foods that are high in fiber, such as beans, whole grains, and fresh fruits and vegetables.  Limit foods that are high in fat and processed sugars, such as fried or sweet foods. Activity  Gradually return to your normal activities as told by your health care provider.  Avoid activities that take a lot of effort and energy (are strenuous) until approved by your health care provider. Walking at a slow to moderate pace is usually safe. Ask your health care provider what activities are safe for you. ? Do not lift anything that is heavier than your baby or 10 lb (4.5 kg) as told by your health care provider. ? Do not vacuum, climb stairs, or drive a car for as long as told by your health care provider.  If possible, have someone help you at home until you are able to do your usual activities yourself.  Rest as much as possible. Try to rest or take naps while your baby is sleeping. Vaginal bleeding  It is normal to have vaginal bleeding (lochia) after delivery. Wear a sanitary pad to absorb vaginal bleeding and discharge. ? During the first week after delivery, the amount and  appearance of lochia is often similar to a menstrual period. ? Over the next few weeks, it will gradually decrease to a dry, yellow-brown discharge. ? For most women, lochia stops completely by 4-6 weeks after delivery. Vaginal bleeding can vary from woman to woman.  Change your sanitary pads frequently. Watch for any changes in your flow, such as: ? A sudden increase in volume. ? A change in color. ? Large blood clots.  If you pass a blood clot, save it and call your health care provider to discuss. Do not flush blood clots down the toilet before you get instructions from your health care provider.  Do not use tampons or douches until your health care provider says this is safe.  If you are not breastfeeding, your period should return 6-8 weeks after delivery. If you are breastfeeding, your period may return anytime between 8 weeks  after delivery and the time that you stop breastfeeding. Perineal care   If your C-section (Cesarean section) was unplanned, and you were allowed to labor and push before delivery, you may have pain, swelling, and discomfort of the tissue between your vaginal opening and your anus (perineum). You may also have an incision in the tissue (episiotomy) or the tissue may have torn during delivery. Follow these instructions as told by your health care provider: ? Keep your perineum clean and dry as told by your health care provider. Use medicated pads and pain-relieving sprays and creams as directed. ? If you have an episiotomy or vaginal tear, check the area every day for signs of infection. Check for:  Redness, swelling, or pain.  Fluid or blood.  Warmth.  Pus or a bad smell. ? You may be given a squirt bottle to use instead of wiping to clean the perineum area after you go to the bathroom. As you start healing, you may use the squirt bottle before wiping yourself. Make sure to wipe gently. ? To relieve pain caused by an episiotomy, vaginal tear, or hemorrhoids,  try taking a warm sitz bath 2-3 times a day. A sitz bath is a warm water bath that is taken while you are sitting down. The water should only come up to your hips and should cover your buttocks. Breast care  Within the first few days after delivery, your breasts may feel heavy, full, and uncomfortable (breast engorgement). You may also have milk leaking from your breasts. Your health care provider can suggest ways to help relieve breast discomfort. Breast engorgement should go away within a few days.  If you are breastfeeding: ? Wear a bra that supports your breasts and fits you well. ? Keep your nipples clean and dry. Apply creams and ointments as told by your health care provider. ? You may need to use breast pads to absorb milk leakage. ? You may have uterine contractions every time you breastfeed for several weeks after delivery. Uterine contractions help your uterus return to its normal size. ? If you have any problems with breastfeeding, work with your health care provider or a Advertising copywriter.  If you are not breastfeeding: ? Avoid touching your breasts as this can make your breasts produce more milk. ? Wear a well-fitting bra and use cold packs to help with swelling. ? Do not squeeze out (express) milk. This causes you to make more milk. Intimacy and sexuality  Ask your health care provider when you can engage in sexual activity. This may depend on your: ? Risk of infection. ? Healing rate. ? Comfort and desire to engage in sexual activity.  You are able to get pregnant after delivery, even if you have not had your period. If desired, talk with your health care provider about methods of family planning or birth control (contraception). Lifestyle  Do not use any products that contain nicotine or tobacco, such as cigarettes, e-cigarettes, and chewing tobacco. If you need help quitting, ask your health care provider.  Do not drink alcohol, especially if you are  breastfeeding. Eating and drinking   Drink enough fluid to keep your urine pale yellow.  Eat high-fiber foods every day. These may help prevent or relieve constipation. High-fiber foods include: ? Whole grain cereals and breads. ? Brown rice. ? Beans. ? Fresh fruits and vegetables.  Take your prenatal vitamins until your postpartum checkup or until your health care provider tells you it is okay to stop. General instructions  Keep all follow-up visits for you and your baby as told by your health care provider. Most women visit their health care provider for a postpartum checkup within the first 3-6 weeks after delivery. Contact a health care provider if you:  Feel unable to cope with the changes that a new baby brings to your life, and these feelings do not go away.  Feel unusually sad or worried.  Have breasts that are painful, hard, or turn red.  Have a fever.  Have trouble holding urine or keeping urine from leaking.  Have little or no interest in activities you used to enjoy.  Have not breastfed at all and you have not had a menstrual period for 12 weeks after delivery.  Have stopped breastfeeding and you have not had a menstrual period for 12 weeks after you stopped breastfeeding.  Have questions about caring for yourself or your baby.  Pass a blood clot from your vagina. Get help right away if you:  Have chest pain.  Have difficulty breathing.  Have sudden, severe leg pain.  Have severe pain or cramping in your abdomen.  Bleed from your vagina so much that you fill more than one sanitary pad in one hour. Bleeding should not be heavier than your heaviest period.  Develop a severe headache.  Faint.  Have blurred vision or spots in your vision.  Have a bad-smelling vaginal discharge.  Have thoughts about hurting yourself or your baby. If you ever feel like you may hurt yourself or others, or have thoughts about taking your own life, get help right away. You  can go to your nearest emergency department or call:  Your local emergency services (911 in the [place]  A suicide crisis helpline, such as the National Suicide Prevention Lifeline at (818) 784-8560. This is open 24 hours a day. Summary  The period of time from when you deliver your baby to up to 6-12 weeks after delivery is called the postpartum period.  Gradually return to your normal activities as told by your health care provider.  Keep all follow-up visits for you and your baby as told by your health care provider. This information is not intended to replace advice given to you by your health care provider. Make sure you discuss any questions you have with your health care provider. Document Revised: 11/19/2017 Document Reviewed: 11/19/2017 Elsevier Patient Education  2020 [employer].  Postpartum Baby Blues The postpartum period begins right after the birth of a baby. During this time, there is often a lot of joy and excitement. It is also a time of many changes in the life of the parents. No matter how many times a mother gives birth, each child brings new challenges to the family, including different ways of relating to one another. It is common to have feelings of excitement along with confusing changes in moods, emotions, and thoughts. You may feel happy one minute and sad or stressed the next. These feelings of sadness usually happen in the period right after you have your baby, and they go away within a week or two. This is called the "baby blues." What are the causes? There is no known cause of baby blues. It is likely caused by a combination of factors. However, changes in hormone levels after childbirth are believed to trigger some of the symptoms. Other factors that can play a role in these mood changes include:  Lack of sleep.  Stressful life events, such as poverty, caring for a loved one, or  death of a loved one.  Genetics. What are the signs or symptoms? Symptoms of  this condition include:  Brief changes in mood, such as going from extreme happiness to sadness.  Decreased concentration.  Difficulty sleeping.  Crying spells and tearfulness.  Loss of appetite.  Irritability.  Anxiety. If the symptoms of baby blues last for more than 2 weeks or become more severe, you may have postpartum depression. How is this diagnosed? This condition is diagnosed based on an evaluation of your symptoms. There are no medical or lab tests that lead to a diagnosis, but there are various questionnaires that a health care provider may use to identify women with the baby blues or postpartum depression. How is this treated? Treatment is not needed for this condition. The baby blues usually go away on their own in 1-2 weeks. Social support is often all that is needed. You will be encouraged to get adequate sleep and rest. Follow these instructions at home: Lifestyle      Get as much rest as you can. Take a nap when the baby sleeps.  Exercise regularly as told by your health care provider. Some women find yoga and walking to be helpful.  Eat a balanced and nourishing diet. This includes plenty of fruits and vegetables, whole grains, and lean proteins.  Do little things that you enjoy. Have a cup of tea, take a bubble bath, read your favorite magazine, or listen to your favorite music.  Avoid alcohol.  Ask for help with household chores, cooking, grocery shopping, or running errands. Do not try to do everything yourself. Consider hiring a postpartum doula to help. This is a professional who specializes in providing support to new mothers.  Try not to make any major life changes during pregnancy or right after giving birth. This can add stress. General instructions  Talk to people close to you about how you are feeling. Get support from your partner, family members, friends, or other new moms. You may want to join a support group.  Find ways to cope with stress.  This may include: ? Writing your thoughts and feelings in a journal. ? Spending time outside. ? Spending time with people who make you laugh.  Try to stay positive in how you think. Think about the things you are grateful for.  Take over-the-counter and prescription medicines only as told by your health care provider.  Let your health care provider know if you have any concerns.  Keep all postpartum visits as told by your health care provider. This is important. Contact a health care provider if:  Your baby blues do not go away after 2 weeks. Get help right away if:  You have thoughts of taking your own life (suicidal thoughts).  You think you may harm the baby or other people.  You see or hear things that are not there (hallucinations). Summary  After giving birth, you may feel happy one minute and sad or stressed the next. Feelings of sadness that happen right after the baby is born and go away after a week or two are called the "baby blues."  You can manage the baby blues by getting enough rest, eating a healthy diet, exercising, spending time with supportive people, and finding ways to cope with stress.  If feelings of sadness and stress last longer than 2 weeks or get in the way of caring for your baby, talk to your health care provider. This may mean you have postpartum depression. This information is  not intended to replace advice given to you by your health care provider. Make sure you discuss any questions you have with your health care provider. Document Revised: 07/24/2018 Document Reviewed: 05/28/2016 Elsevier Patient Education  2020 [employer].

## 2019-06-08 NOTE — Discharge Summary (Signed)
Obstetric Discharge Summary  Patient ID: Autumn Todd MRN: [id number] DOB/AGE: May 27, 1985 34 y.o.   Date of Admission: 06/05/2019  Date of Discharge:  06/08/19  Admitting Diagnosis: Premature rupture of membrane and repeat cesarean section at [redacted]w[redacted]d  Secondary Diagnosis: See prenatal record  Mode of Delivery: repeat cesarean section- low uterine, transverse     Discharge Diagnosis: No other diagnosis   Intrapartum Procedures: Spinal and GBS prophylaxis   Post partum procedures: None  Complications: none   Brief Hospital Course   Autumn Todd is a [id number] who underwent cesarean section on 06/06/2019.  Patient had an uncomplicated surgery; for further details of this surgery, please refer to the operative note.  Patient had an uncomplicated postpartum course.  By time of discharge on POD#2/PPD#2, her pain was controlled on oral pain medications; she had appropriate lochia and was ambulating, voiding without difficulty, tolerating regular diet and passing flatus.   She was deemed stable for discharge to home.    Labs: CBC Latest Ref Rng & Units 06/06/2019 04/05/2019 12/09/2018  WBC 4.0 - 10.5 K/uL 10.5 9.9 9.4  Hemoglobin 12.0 - 15.0 g/dL 11.4(L) 11.1 12.0  Hematocrit 36.0 - 46.0 % 35.2(L) 33.0(L) 35.9  Platelets 150 - 400 K/uL 252 277 319   O POS  Physical exam completed by Dr Rubie Maid.   Temp:  [98 F (36.7 C)-98.4 F (36.9 C)] 98.2 F (36.8 C) (02/23 0747) Pulse Rate:  [92-96] 94 (02/23 0747) Resp:  [18] 18 (02/23 0747) BP: (103-116)/(62-68) 106/68 (02/23 0747) SpO2:  [99 %-100 %] 99 % (02/23 0747)  General: alert and no distress  Lochia: appropriate  Abdomen: soft, NT  Uterine Fundus: firm  Incision: healing well, no significant drainage, no dehiscence, no significant erythema  Extremities: No evidence of DVT seen on physical exam. No lower extremity edema.  Discharge Instructions: Per After Visit Summary.  Activity: Advance as tolerated. Pelvic  rest for 6 weeks.  Also refer to After Visit Summary  Diet: Regular  Medications: Allergies as of 06/08/2019   No Known Allergies     Medication List    TAKE these medications   busPIRone 7.5 MG tablet Commonly known as: BUSPAR TAKE 1 TABLET(7.5 MG) BY MOUTH TWICE DAILY   famotidine 20 MG tablet Commonly known as: PEPCID Take 20 mg by mouth 2 (two) times daily.   ibuprofen 800 MG tablet Commonly known as: ADVIL Take 1 tablet (800 mg total) by mouth every 8 (eight) hours.   levocetirizine 5 MG tablet Commonly known as: XYZAL   loratadine 10 MG tablet Commonly known as: CLARITIN Take 1 tablet (10 mg total) by mouth daily.   oxyCODONE-acetaminophen 5-325 MG tablet Commonly known as: PERCOCET/ROXICET Take 1-2 tablets by mouth every 4 (four) hours as needed for moderate pain.   prenatal multivitamin Tabs tablet Take 1 tablet by mouth daily at 12 noon.   senna-docusate 8.6-50 MG tablet Commonly known as: Senokot-S Take 2 tablets by mouth daily. Start taking on: June 09, 2019   simethicone 80 MG chewable tablet Commonly known as: MYLICON Chew 1 tablet (80 mg total) by mouth 4 (four) times daily.            Discharge Care Instructions  (From admission, onward)         Start     Ordered   06/08/19 0000  Discharge wound care:    Comments: Keep clean and dry; wash with hands and antibacterial soap, pat dry; May cover with clean unscented pad or white  washcloth   06/08/19 1359         Outpatient follow up:  Follow-up Information    Doreene Burke, CNM. Call in 1 week(s).   Specialties: Certified Nurse Midwife, Radiology Why: The office will call you to schedule a one (1) week incision check and six (6) week postpartum visit Contact information: [address] [place] [address] [phone]          Postpartum contraception: Will discuss further at postpartum visit  Discharged Condition: stable  Discharged to:  home   Newborn Data:  Disposition:home with mother  Apgars: APGAR (1 MIN): 9   APGAR (5 MINS): 9    Baby Feeding: Breast   Gunnar Bulla, CNM [facility] 06/08/19 2:08 PM

## 2019-06-08 NOTE — Lactation Note (Signed)
This note was copied from a baby's chart. Lactation Consultation Note  Patient Name: Autumn Todd [id number] Date: 06/08/2019 Reason for consult: Follow-up assessment  LC student went in to have discharge conversation. [facility] student discussed signs of contentment between feeds, wet and dirty diapers to indicate that feedings are going well. Breast care for fullness and/or engorgement. Baby was ready to feed and FOB assisted MOB with pillows and positioning to feed baby in football hold. Baby latched well to 24 nipple shield. [facility] student discussed attempting to hand express prior to BF to soften breast for a better latch.  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 LC student advised MOB to call with any questions or concerns. Advised MOB that she can set up an OP appointment, if needed, prior to returning to work.  Maternal Data Formula Feeding for Exclusion: No Has patient been taught Hand Expression?: Yes Does the patient have breastfeeding experience prior to this delivery?: Yes  Feeding Feeding Type: Breast Fed  LATCH Score Latch: Grasps breast easily, tongue down, lips flanged, rhythmical sucking.  Audible Swallowing: None(Baby had just started to feed)  Type of Nipple: Flat  Comfort (Breast/Nipple): Soft / non-tender  Hold (Positioning): No assistance needed to correctly position infant at breast.  LATCH Score: 7  Interventions Interventions: Breast feeding basics reviewed;Hand express  Lactation Tools Discussed/Used Tools: Nipple Shields Nipple shield size: 24   Consult Status Consult Status: Complete Date: 06/08/19 Follow-up type: Call as needed    Dale [place] 06/08/2019, 10:31 AM

## 2019-06-09 ENCOUNTER — Encounter: Payer: BC Managed Care – PPO | Admitting: Obstetrics and Gynecology

## 2019-06-09 ENCOUNTER — Telehealth: Payer: Self-pay | Admitting: Certified Nurse Midwife

## 2019-06-09 ENCOUNTER — Encounter: Payer: BC Managed Care – PPO | Admitting: Certified Nurse Midwife

## 2019-06-09 NOTE — Telephone Encounter (Signed)
[facility] for 6 week ppv. Pt already has 1 week with annie.

## 2019-06-14 ENCOUNTER — Ambulatory Visit: Payer: BC Managed Care – PPO | Admitting: Certified Nurse Midwife

## 2019-06-14 ENCOUNTER — Encounter: Payer: Self-pay | Admitting: Certified Nurse Midwife

## 2019-06-14 ENCOUNTER — Other Ambulatory Visit: Payer: Self-pay

## 2019-06-14 VITALS — BP 118/73 | HR 90 | Ht 60.0 in | Wt 173.5 lb

## 2019-06-14 DIAGNOSIS — Z4889 Encounter for other specified surgical aftercare: Secondary | ICD-10-CM | POA: Diagnosis not present

## 2019-06-14 NOTE — Patient Instructions (Signed)
Sutured Wound Care Sutures are stitches that can be used to close wounds. Taking care of your wound properly can help prevent pain and infection. It can also help your wound to heal more quickly. Follow instructions from your doctor about how to care for your sutured wound. Supplies needed:  Soap and water.  A clean bandage (dressing), if needed.  Antibiotic ointment.  A clean towel. How to care for your sutured wound   Keep the wound completely dry for the first 24 hours or as long as told by your doctor. After 24-48 hours, you may shower or bathe as told by your doctor. Do not soak the wound or put the wound completely under water until the sutures have been removed.  After the first 24 hours, clean the wound once a day, or as often as your doctor tells you to. Take these steps: ? Wash the wound with soap and water. ? Rinse the wound with water. Make sure to wash all the soap off. ? Pat the wound dry with a clean towel. Do not rub the wound.  After cleaning the wound, put a thin layer of antibiotic ointment on the wound as told by your doctor. This will help: ? Prevent infection. ? Keep the bandage from sticking to the wound.  Follow instructions from your doctor about how to change your bandage: ? Wash your hands with soap and water. If you cannot use soap and water, use hand sanitizer. ? Change your bandage at least once a day, or as often as told by your doctor. If your dressing gets wet or dirty, change it. ? Leave sutures, skin glue, or skin tape (adhesive) strips in place. They may need to stay in place for 2 weeks or longer. If tape strips get loose and curl up, you may trim the loose edges. Do not remove tape strips completely unless your doctor says it is okay.  Check your wound every day for signs of infection. Watch for: ? Redness, swelling, or pain. ? Fluid or blood. ? Warmth. ? Pus or a bad smell.  Have the sutures removed as told by your doctor. Follow these  instructions at home: Medicines  Take or apply over-the-counter and prescription medicines only as told by your doctor.  If you were prescribed an antibiotic medicine or ointment, take or apply it as told by your doctor. Do not stop using the antibiotic even if you start to feel better. General instructions  Cover your wound with clothes or put sunscreen on when you are outside. Use a sunscreen of at least 30 SPF.  Do not scratch or pick at your wound.  Avoid stretching your wound.  Raise (elevate) the injured area above the level of your heart while you are sitting or lying down, if possible.  Drink enough fluids to keep your pee (urine) clear or pale yellow.  Keep all follow-up visits as told by your doctor. This is important. Contact a doctor if:  You were given a tetanus shot and you have any of the following at the site where the needle went in: ? Swelling. ? Very bad pain. ? Redness. ? Bleeding.  Your wound breaks open.  You have redness, swelling, or pain around your wound.  You have fluid or blood coming from your wound.  Your wound feels warm to the touch.  You have a fever.  You notice something coming out of your wound, such as wood or glass.  You have pain that does not   get better with medicine.  The skin near your wound changes color.  You need to change your bandage often due to a lot of fluid, blood, or pus coming from the wound.  You get a new rash.  You get numbness around the wound. Get help right away if:  You have very bad swelling around your wound.  You have pus or a bad smell coming from your wound.  Your pain suddenly gets worse and is very bad.  You have painful lumps near your wound or anywhere on your body.  You have a red streak going away from your wound.  The wound is on your hand or foot, and: ? You cannot move a finger or toe as you used to do. ? Your fingers or toes look pale or blue. ? You have numbness that spreads down  your hand, foot, fingers, or toes. Summary  Sutures are stitches that are used to close wounds.  Taking care of your wound properly can help prevent pain and infection.  Keep the wound completely dry for the first 24 hours or for as long as told by your doctor. After 24-48 hours, you may shower or bathe as directed by your doctor. This information is not intended to replace advice given to you by your health care provider. Make sure you discuss any questions you have with your health care provider. Document Revised: 03/14/2017 Document Reviewed: 05/07/2016 Elsevier Patient Education  2020 [employer]vier Inc[employer]  

## 2019-06-14 NOTE — Progress Notes (Signed)
    OBSTETRICS/GYNECOLOGY POST-OPERATIVE CLINIC VISIT  Subjective:     Linlee Cromie is a 34 y.o. female who presents to the clinic 1 weeks status post repeat cesarean section  . Eating a regular diet without difficulty. Bowel movements are normal. Pain is controlled with current analgesics. Medications being used: acetaminophen and ibuprofen (OTC).  The following portions of the patient's history were reviewed and updated as appropriate: allergies, current medications, past family history, past medical history, past social history, past surgical history and problem list.  Review of Systems Pertinent items are noted in HPI.    Objective:    BP 118/73   Pulse 90   Ht 5' (1.524 m)   Wt 173 lb 8 oz (78.7 kg)   Breastfeeding Yes   BMI 33.88 kg/m  General:  alert and no distress  Abdomen: soft, bowel sounds active, non-tender  Incision:   healing well, no drainage, no erythema, no hernia, no seroma, no swelling, no dehiscence, incision well approximated    Pathology:    Assessment:    Doing well postoperatively.   Plan:   1. Continue any current medications. 2. Wound care discussed. 3. Operative findings again reviewed. Pathology report discussed. 4. Activity restrictions: no lifting more than 10 pounds 5. Anticipated return to work: not applicable. 6. Follow up: 5 weeks for ppv    Doreene Burke, CNM [facility]

## 2019-06-15 ENCOUNTER — Other Ambulatory Visit: Payer: Self-pay | Admitting: Certified Nurse Midwife

## 2019-06-16 ENCOUNTER — Encounter: Payer: BC Managed Care – PPO | Admitting: Certified Nurse Midwife

## 2019-06-18 ENCOUNTER — Inpatient Hospital Stay
Admission: RE | Admit: 2019-06-18 | Payer: BC Managed Care – PPO | Source: Home / Self Care | Admitting: Obstetrics and Gynecology

## 2019-06-18 ENCOUNTER — Encounter: Admission: RE | Payer: Self-pay | Source: Home / Self Care

## 2019-06-18 SURGERY — Surgical Case
Anesthesia: Spinal

## 2019-06-24 ENCOUNTER — Other Ambulatory Visit: Payer: Self-pay | Admitting: Certified Nurse Midwife

## 2019-06-25 ENCOUNTER — Ambulatory Visit: Payer: BC Managed Care – PPO | Attending: Internal Medicine

## 2019-06-25 DIAGNOSIS — Z23 Encounter for immunization: Secondary | ICD-10-CM

## 2019-06-25 NOTE — Progress Notes (Signed)
   Covid-19 Vaccination Clinic  Name:  Autumn Todd    MRN: [id number] DOB: 1985-11-04  06/25/2019  [name] was observed post Covid-19 immunization for 15 minutes without incident. She was provided with Vaccine Information Sheet and instruction to access the V-Safe system.   [name] was instructed to call 911 with any severe reactions post vaccine: Marland Kitchen Difficulty breathing  . Swelling of face and throat  . A fast heartbeat  . A bad rash all over body  . Dizziness and weakness   Immunizations Administered    Name Date Dose VIS Date Route   Moderna COVID-19 Vaccine 06/25/2019  1:26 PM 0.5 mL 03/16/2019 Intramuscular   Manufacturer: Moderna   Lot: [id number]   NDC: 78412-820-81

## 2019-06-26 ENCOUNTER — Ambulatory Visit: Payer: BC Managed Care – PPO

## 2019-07-26 ENCOUNTER — Encounter: Payer: Self-pay | Admitting: Certified Nurse Midwife

## 2019-07-26 ENCOUNTER — Ambulatory Visit (INDEPENDENT_AMBULATORY_CARE_PROVIDER_SITE_OTHER): Payer: BC Managed Care – PPO | Admitting: Certified Nurse Midwife

## 2019-07-26 ENCOUNTER — Other Ambulatory Visit: Payer: Self-pay

## 2019-07-26 MED ORDER — NORETHIN-ETH ESTRAD-FE BIPHAS 1 MG-10 MCG / 10 MCG PO TABS: 1.0000 | ORAL_TABLET | Freq: Every day | ORAL | 11 refills | Status: DC

## 2019-07-26 NOTE — Patient Instructions (Signed)
Preventive Care 37-32 Years Old, Female Preventive care refers to visits with your health care provider and lifestyle choices that can promote health and wellness. This includes:  A yearly physical exam. This may also be called an annual well check.  Regular dental visits and eye exams.  Immunizations.  Screening for certain conditions.  Healthy lifestyle choices, such as eating a healthy diet, getting regular exercise, not using drugs or products that contain nicotine and tobacco, and limiting alcohol use. What can I expect for my preventive care visit? Physical exam Your health care provider will check your:  Height and weight. This may be used to calculate body mass index (BMI), which tells if you are at a healthy weight.  Heart rate and blood pressure.  Skin for abnormal spots. Counseling Your health care provider may ask you questions about your:  Alcohol, tobacco, and drug use.  Emotional well-being.  Home and relationship well-being.  Sexual activity.  Eating habits.  Work and work Statistician.  Method of birth control.  Menstrual cycle.  Pregnancy history. What immunizations do I need?  Influenza (flu) vaccine  This is recommended every year. Tetanus, diphtheria, and pertussis (Tdap) vaccine  You may need a Td booster every 10 years. Varicella (chickenpox) vaccine  You may need this if you have not been vaccinated. Human papillomavirus (HPV) vaccine  If recommended by your health care provider, you may need three doses over 6 months. Measles, mumps, and rubella (MMR) vaccine  You may need at least one dose of MMR. You may also need a second dose. Meningococcal conjugate (MenACWY) vaccine  One dose is recommended if you are age 78-21 years and a first-year college student living in a residence hall, or if you have one of several medical conditions. You may also need additional booster doses. Pneumococcal conjugate (PCV13) vaccine  You may need  this if you have certain conditions and were not previously vaccinated. Pneumococcal polysaccharide (PPSV23) vaccine  You may need one or two doses if you smoke cigarettes or if you have certain conditions. Hepatitis A vaccine  You may need this if you have certain conditions or if you travel or work in places where you may be exposed to hepatitis A. Hepatitis B vaccine  You may need this if you have certain conditions or if you travel or work in places where you may be exposed to hepatitis B. Haemophilus influenzae type b (Hib) vaccine  You may need this if you have certain conditions. You may receive vaccines as individual doses or as more than one vaccine together in one shot (combination vaccines). Talk with your health care provider about the risks and benefits of combination vaccines. What tests do I need?  Blood tests  Lipid and cholesterol levels. These may be checked every 5 years starting at age 27.  Hepatitis C test.  Hepatitis B test. Screening  Diabetes screening. This is done by checking your blood sugar (glucose) after you have not eaten for a while (fasting).  Sexually transmitted disease (STD) testing.  BRCA-related cancer screening. This may be done if you have a family history of breast, ovarian, tubal, or peritoneal cancers.  Pelvic exam and Pap test. This may be done every 3 years starting at age 44. Starting at age 3, this may be done every 5 years if you have a Pap test in combination with an HPV test. Talk with your health care provider about your test results, treatment options, and if necessary, the need for more tests.  Follow these instructions at home: Eating and drinking   Eat a diet that includes fresh fruits and vegetables, whole grains, lean protein, and low-fat dairy.  Take vitamin and mineral supplements as recommended by your health care provider.  Do not drink alcohol if: ? Your health care provider tells you not to drink. ? You are  pregnant, may be pregnant, or are planning to become pregnant.  If you drink alcohol: ? Limit how much you have to 0-1 drink a day. ? Be aware of how much alcohol is in your drink. In the [place] one drink equals one 12 oz bottle of beer (355 mL), one 5 oz glass of wine (148 mL), or one 1 oz glass of hard liquor (44 mL). Lifestyle  Take daily care of your teeth and gums.  Stay active. Exercise for at least 30 minutes on 5 or more days each week.  Do not use any products that contain nicotine or tobacco, such as cigarettes, e-cigarettes, and chewing tobacco. If you need help quitting, ask your health care provider.  If you are sexually active, practice safe sex. Use a condom or other form of birth control (contraception) in order to prevent pregnancy and STIs (sexually transmitted infections). If you plan to become pregnant, see your health care provider for a preconception visit. What's next?  Visit your health care provider once a year for a well check visit.  Ask your health care provider how often you should have your eyes and teeth checked.  Stay up to date on all vaccines. This information is not intended to replace advice given to you by your health care provider. Make sure you discuss any questions you have with your health care provider. Document Revised: 12/11/2017 Document Reviewed: 12/11/2017 Elsevier Patient Education  2020 [employer].

## 2019-07-26 NOTE — Progress Notes (Signed)
Subjective:    Autumn Todd is a 34 y.o. G15P2012 Caucasian female who presents for a postpartum visit. She is 6 weeks postpartum following a repeat cesarean section, low transverse incision at 37.5 gestational weeks. Anesthesia: spinal. I have fully reviewed the prenatal and intrapartum course. Postpartum course has been WNL. Baby's course has been WNL. Baby is feeding by breast. Bleeding no bleeding. Bowel function is normal. Bladder function is normal. Patient is not sexually active.. Contraception method is OCP (estrogen/progesterone). Postpartum depression screening: negative. Score 0.  Last pap 10/31/2016 and was negative.  The following portions of the patient's history were reviewed and updated as appropriate: allergies, current medications, past medical history, past surgical history and problem list.  Review of Systems Pertinent items are noted in HPI.   Vitals:   07/26/19 0931  BP: 105/66  Pulse: 87  Weight: 153 lb 1 oz (69.4 kg)  Height: 5' (1.524 m)   No LMP recorded.  Objective:   General:  alert, cooperative and no distress   Breasts:  deferred, no complaints  Lungs: clear to auscultation bilaterally  Heart:  regular rate and rhythm  Abdomen: soft, nontender   Vulva: normal  Vagina: normal vagina  Cervix:  closed  Corpus: Well-involuted  Adnexa:  Non-palpable  Rectal Exam: no hemorrhoids        Assessment:   Postpartum exam 6 wks s/p repeat c/section Breastfeeding Depression screening Contraception counseling   Plan:  : OCP (estrogen/progesterone) Lo Loestrin pill  Follow up in: 6 months for annual exam ( pap smear) or earlier if needed  Doreene Burke, CNM

## 2019-07-27 ENCOUNTER — Ambulatory Visit: Payer: BC Managed Care – PPO | Attending: Internal Medicine

## 2019-07-27 DIAGNOSIS — Z23 Encounter for immunization: Secondary | ICD-10-CM

## 2019-07-27 NOTE — Progress Notes (Signed)
   Covid-19 Vaccination Clinic  Name:  Pattye Meda    MRN: [id number] DOB: 1985-06-17  07/27/2019  Ms. Soderberg was observed post Covid-19 immunization for 15 minutes without incident. She was provided with Vaccine Information Sheet and instruction to access the V-Safe system.   Ms. Escutia was instructed to call 911 with any severe reactions post vaccine: Marland Kitchen Difficulty breathing  . Swelling of face and throat  . A fast heartbeat  . A bad rash all over body  . Dizziness and weakness   Immunizations Administered    Name Date Dose VIS Date Route   Moderna COVID-19 Vaccine 07/27/2019  9:48 AM 0.5 mL 03/16/2019 Intramuscular   Manufacturer: Moderna   Lot: [id number]   NDC: 53202-334-35

## 2019-12-16 NOTE — Progress Notes (Signed)
Name: Autumn Todd   MRN: [id number]    DOB: [birth year]   Date:12/17/2019       Progress Note  Subjective  Chief Complaint  Chief Complaint  Patient presents with  . Rash    rash upper chest right below her neck, small spot has not changed in size, noticed it this Monday, sore above the rash if she touches it but not painful any other time.     I connected with  Mellody Drown on 12/17/19 at  3:00 PM EDT by telephone and verified that I am speaking with the correct person using two identifiers.  I discussed the limitations, risks, security and privacy concerns of performing an evaluation and management service by telephone and the availability of in person appointments. The patient expressed understanding and agreed to proceed. Staff also discussed with the patient that there may be a patient responsible charge related to this service. Patient Location: in car Provider Location: [facility] Additional Individuals present: none  HPI  Patient is a 34 year old female Last visit at [facility] was 12/23/2018 for anxiety with pregnancy.  (with Maurice Small) Follows up today by phone with concerns for a rash in her neck area. A picture was sent to review which was helpful.  Noted rash started Mon or Tuesday, has not spread. Not itch, not very painful, is tender when shirt touches, and tender when presses over or above the rash. Tried HC cream 3X so far once daily Not changed at all since noticed it. No new topical entities to that area, wore a longer necklace the day prior and when took off, felt tenderness in the necklace area but no rash for a day, and then the rash the next day.  Did not wear necklace before. No allergies in the past. No h/o this type of rash. No bug bite history No fevers, no SOB, throat tightness Had the chickenpox when age 9. After delivery of child, told not have and given vaccine.  All - NKDA    Patient Active Problem List   Diagnosis Date Noted  . Labor and  delivery, indication for care 06/06/2019  . History of oligohydramnios in prior pregnancy, currently pregnant 02/11/2019  . Previous cesarean section 02/11/2019  . Maternal varicella, non-immune 02/11/2019  . Declines vaginal birth after cesarean trial 02/11/2019  . Anxiety during pregnancy 12/23/2018  . Environmental allergies 09/29/2018  . Anxiety 03/11/2018  . Aortic valve regurgitation 10/01/2017  . Mitral regurgitation 10/01/2017  . 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 De Quervain's tenosynovitis, left 06/06/2015  . Eczema of right hand 06/06/2015  . Asthma, mild intermittent 10/11/2014    Past Surgical History:  Procedure Laterality Date  . CESAREAN SECTION N/A 04/27/2015   Procedure: CESAREAN SECTION;  Surgeon: Chewey Bing, MD;  Location: [facility] ORS;  Service: Obstetrics;  Laterality: N/A;  . CESAREAN SECTION N/A 06/06/2019   Procedure: CESAREAN SECTION;  Surgeon: Linzie Collin, MD;  Location: [facility] ORS;  Service: Obstetrics;  Laterality: N/A;  . WISDOM TOOTH EXTRACTION      Family History  Problem Relation Age of Onset  . Cancer Maternal Grandfather     Social History   Tobacco Use  . Smoking status: Never Smoker  . Smokeless tobacco: Never Used  Substance Use Topics  . Alcohol use: No    Alcohol/week: 0.0 standard drinks     Current Outpatient Medications:  .  Norethindrone-Ethinyl Estradiol-Fe Biphas (LO LOESTRIN FE) 1 MG-10 MCG / 10 MCG  tablet, Take 1 tablet by mouth daily., Disp: 1 Package, Rfl: 11 .  DOK 100 MG capsule, Take 200 mg by mouth daily., Disp: , Rfl:  .  famotidine (PEPCID) 20 MG tablet, Take 20 mg by mouth 2 (two) times daily., Disp: , Rfl:  .  ibuprofen (ADVIL) 800 MG tablet, TAKE 1 TABLET(800 MG) BY MOUTH EVERY 8 HOURS, Disp: 30 tablet, Rfl: 0 .  levocetirizine (XYZAL) 5 MG tablet, , Disp: , Rfl:  .  loratadine (CLARITIN) 10 MG tablet, Take 1 tablet (10 mg total) by mouth daily. (Patient not taking:  Reported on 06/14/2019), Disp: 90 tablet, Rfl: 1 .  oxyCODONE-acetaminophen (PERCOCET/ROXICET) 5-325 MG tablet, Take 1-2 tablets by mouth every 4 (four) hours as needed for moderate pain. (Patient not taking: Reported on 06/14/2019), Disp: 30 tablet, Rfl: 0 .  Prenatal Vit-Fe Fumarate-FA (PRENATAL MULTIVITAMIN) TABS tablet, Take 1 tablet by mouth daily at 12 noon., Disp: , Rfl:  .  senna-docusate (SENOKOT-S) 8.6-50 MG tablet, Take 2 tablets by mouth daily. (Patient not taking: Reported on 06/14/2019), Disp: 14 tablet, Rfl: 0 .  simethicone (MYLICON) 80 MG chewable tablet, Chew 1 tablet (80 mg total) by mouth 4 (four) times daily. (Patient not taking: Reported on 06/14/2019), Disp: 30 tablet, Rfl: 0  No Known Allergies  With staff assistance, above reviewed with the patient today.  ROS: As per HPI, otherwise no specific complaints on a limited and focused system review   Objective  Virtual encounter, vitals not obtained.  There is no height or weight on file to calculate BMI.  Physical Exam   Appears in NAD via conversation, very pleasant Breathing: No obvious respiratory distress. Speaking in complete sentences Neurological: Pt is alert, Speech is normal Psychiatric: Patient has a normal mood and affect, Judgment and thought content normal.  Picture she sent x2 were reviewed.  The second picture had more of a vesicular concern noted and she did confirm that they were some raised bumps with the rash.  No results found for this or any previous visit (from the past 72 hour(s)).  PHQ2/9: Depression screen [facility] 2/9 12/17/2019 07/26/2019 12/23/2018 11/09/2018 09/29/2018  Decreased Interest 0 0 0 0 0  Down, Depressed, Hopeless 0 0 0 0 0  PHQ - 2 Score 0 0 0 0 0  Altered sleeping - 0 0 0 0  Tired, decreased energy - 0 0 0 0  Change in appetite - 0 0 0 0  Feeling bad or failure about yourself  - 0 0 0 0  Trouble concentrating - 0 0 0 0  Moving slowly or fidgety/restless - 0 0 0 0  Suicidal thoughts - 0  0 0 0  PHQ-9 Score - 0 0 0 0  Difficult doing work/chores - - Not difficult at all Not difficult at all Not difficult at all   PHQ-2/9 Result reviewed  Fall Risk: Fall Risk  12/17/2019 12/23/2018 11/09/2018 09/29/2018 03/11/2018  Falls in the past year? 0 0 0 0 0  Number falls in past yr: 0 0 0 0 0  Injury with Fall? 0 0 0 0 -  Follow up Falls evaluation completed Falls evaluation completed - - -     Assessment & Plan  1. Herpes zoster without complication 2. Rash Did appreciate her sending a picture to try to help, although was a little difficult with this not an in person visit, and the second picture looked a little different than at first, even though they were taken about a  minute apart. As above noted, the one picture showed more of a vesicular component to the rash, and it was more tender than itchy.  It does not follow a classic dermatomal appearance, although zoster is certainly in the differential.  It does not itch, and seems less likely a contact dermatitis from the necklace she noted wearing the day prior.  Does not appear like a typical impetigo or bullous impetigo Numerous entities in the differential of this rash, and discussed it is not totally clear of the source with her today. Discussed options, and felt best to start a Valtrex product-3 times daily for potential zoster, as some concern the tenderness and discomfort could progress if it was zoster without treatment. Would not apply anything topically presently, although if does become more itchy over the weekend, can use a little hydrocortisone topically as needed for the itch, and also a Benadryl product-50 mg at bedtime, but would be careful using during the day as it can make drowsy.  Could use a 25 mg tablet during the day being careful as may make drowsy. Await her response, and if does start to progress, or she develops any more concerning symptoms in association, she should follow-up, and she was understanding of that.   If starts to get more concerning symptoms like fevers, significant progression, she may need to follow-up more urgently with an urgent care or emergency setting over the weekend.  - valACYclovir (VALTREX) 1000 MG tablet; Take 1 tablet (1,000 mg total) by mouth 3 (three) times daily.  Dispense: 21 tablet; Refill: 0  Await her response to the above presently.  I discussed the assessment and treatment plan with the patient. The patient was provided an opportunity to ask questions and all were answered. The patient agreed with the plan and demonstrated an understanding of the instructions.   The patient was advised to call back or seek an in-person evaluation if the symptoms worsen or if the condition fails to improve as anticipated.  I provided 15 minutes of non-face-to-face time during this encounter that included discussing at length patient's sx/history, pertinent pmhx, medications, treatment and follow up plan. This time also included the necessary documentation, orders, and chart review.  Jamelle Haring, MD

## 2019-12-17 ENCOUNTER — Encounter: Payer: Self-pay | Admitting: Internal Medicine

## 2019-12-17 ENCOUNTER — Telehealth (INDEPENDENT_AMBULATORY_CARE_PROVIDER_SITE_OTHER): Payer: BC Managed Care – PPO | Admitting: Internal Medicine

## 2019-12-17 DIAGNOSIS — B029 Zoster without complications: Secondary | ICD-10-CM

## 2019-12-17 DIAGNOSIS — R21 Rash and other nonspecific skin eruption: Secondary | ICD-10-CM | POA: Diagnosis not present

## 2019-12-17 MED ORDER — VALACYCLOVIR HCL 1 G PO TABS: 1000.0000 mg | ORAL_TABLET | Freq: Three times a day (TID) | ORAL | 0 refills | Status: DC

## 2020-01-26 ENCOUNTER — Encounter: Payer: BC Managed Care – PPO | Admitting: Certified Nurse Midwife

## 2020-03-29 ENCOUNTER — Encounter: Payer: Self-pay | Admitting: Family Medicine

## 2020-03-31 ENCOUNTER — Telehealth: Payer: Self-pay | Admitting: Family Medicine

## 2020-03-31 NOTE — Telephone Encounter (Signed)
Autumn Todd and I and the other girls up front has not seen anythig and we looked back in the chart and we do not see anything either.

## 2020-03-31 NOTE — Telephone Encounter (Signed)
Spoke to [place] and she she checked her chart. No one in office called patient

## 2020-04-04 ENCOUNTER — Encounter: Payer: Self-pay | Admitting: Certified Nurse Midwife

## 2020-04-04 ENCOUNTER — Other Ambulatory Visit (HOSPITAL_COMMUNITY)
Admission: RE | Admit: 2020-04-04 | Discharge: 2020-04-04 | Disposition: A | Discharge status: Home / Self Care | Payer: BC Managed Care – PPO | Source: Ambulatory Visit | Attending: Certified Nurse Midwife | Admitting: Certified Nurse Midwife

## 2020-04-04 ENCOUNTER — Ambulatory Visit (INDEPENDENT_AMBULATORY_CARE_PROVIDER_SITE_OTHER): Payer: BC Managed Care – PPO | Admitting: Certified Nurse Midwife

## 2020-04-04 ENCOUNTER — Other Ambulatory Visit: Payer: Self-pay

## 2020-04-04 VITALS — BP 116/69 | HR 100 | Ht 60.0 in | Wt 149.4 lb

## 2020-04-04 DIAGNOSIS — Z13220 Encounter for screening for lipoid disorders: Secondary | ICD-10-CM | POA: Diagnosis not present

## 2020-04-04 DIAGNOSIS — Z23 Encounter for immunization: Secondary | ICD-10-CM | POA: Diagnosis not present

## 2020-04-04 DIAGNOSIS — Z124 Encounter for screening for malignant neoplasm of cervix: Secondary | ICD-10-CM | POA: Diagnosis present

## 2020-04-04 DIAGNOSIS — Z01419 Encounter for gynecological examination (general) (routine) without abnormal findings: Secondary | ICD-10-CM | POA: Diagnosis present

## 2020-04-04 DIAGNOSIS — Z1159 Encounter for screening for other viral diseases: Secondary | ICD-10-CM

## 2020-04-04 MED ORDER — NORETHIN-ETH ESTRAD-FE BIPHAS 1 MG-10 MCG / 10 MCG PO TABS: 1.0000 | ORAL_TABLET | Freq: Every day | ORAL | 11 refills | Status: DC

## 2020-04-04 NOTE — Progress Notes (Signed)
GYNECOLOGY ANNUAL PREVENTATIVE CARE ENCOUNTER NOTE  History:     Autumn Todd is a 34 y.o. [phone] female here for a routine annual gynecologic exam.  Current complaints: none.   Denies abnormal vaginal bleeding, discharge, pelvic pain, problems with intercourse or other gynecologic concerns.     Social Relationship: married  Living:spouse and 2 kids Work: Runner, broadcasting/film/video Exercise: none currently Smoke/Alcohol/drug use: none  Gynecologic History No LMP recorded. Contraception: OCP (estrogen/progesterone) Last Pap: 10/31/16. Results were: normal Last mammogram: n/a  Obstetric History OB History  Gravida Para Term Preterm AB Living  3 2 2   1 2   SAB IAB Ectopic Multiple Live Births  1     0 2    # Outcome Date GA Lbr Len/2nd Weight Sex Delivery Anes PTL Lv  3 Term 06/06/19 [redacted]w[redacted]d  7 lb 1.9 oz (3.23 kg) M CS-LTranv Spinal  LIV  2 SAB 2020 [redacted]w[redacted]d         1 Term 04/27/15 [redacted]w[redacted]d  6 lb 0.7 oz (2.74 kg) F CS-LTranv Spinal, EPI  LIV    Past Medical History:  Diagnosis Date   Allergy    Anemia 07/24/2015   Anxiety 03/11/2018   Asthma    not in four years   Sinus tachycardia     Past Surgical History:  Procedure Laterality Date   CESAREAN SECTION N/A 04/27/2015   Procedure: CESAREAN SECTION;  Surgeon: 06/25/2015, MD;  Location: [facility] ORS;  Service: Obstetrics;  Laterality: N/A;   CESAREAN SECTION N/A 06/06/2019   Procedure: CESAREAN SECTION;  Surgeon: 06/08/2019, MD;  Location: [facility] ORS;  Service: Obstetrics;  Laterality: N/A;   WISDOM TOOTH EXTRACTION      Current Outpatient Medications on File Prior to Visit  Medication Sig Dispense Refill   DOK 100 MG capsule Take 200 mg by mouth daily.     famotidine (PEPCID) 20 MG tablet Take 20 mg by mouth 2 (two) times daily.     ibuprofen (ADVIL) 800 MG tablet TAKE 1 TABLET(800 MG) BY MOUTH EVERY 8 HOURS 30 tablet 0   levocetirizine (XYZAL) 5 MG tablet      loratadine (CLARITIN) 10 MG tablet Take 1 tablet (10  mg total) by mouth daily. (Patient not taking: Reported on 06/14/2019) 90 tablet 1   Norethindrone-Ethinyl Estradiol-Fe Biphas (LO LOESTRIN FE) 1 MG-10 MCG / 10 MCG tablet Take 1 tablet by mouth daily. 1 Package 11   oxyCODONE-acetaminophen (PERCOCET/ROXICET) 5-325 MG tablet Take 1-2 tablets by mouth every 4 (four) hours as needed for moderate pain. (Patient not taking: Reported on 06/14/2019) 30 tablet 0   Prenatal Vit-Fe Fumarate-FA (PRENATAL MULTIVITAMIN) TABS tablet Take 1 tablet by mouth daily at 12 noon.     senna-docusate (SENOKOT-S) 8.6-50 MG tablet Take 2 tablets by mouth daily. (Patient not taking: Reported on 06/14/2019) 14 tablet 0   simethicone (MYLICON) 80 MG chewable tablet Chew 1 tablet (80 mg total) by mouth 4 (four) times daily. (Patient not taking: Reported on 06/14/2019) 30 tablet 0   valACYclovir (VALTREX) 1000 MG tablet Take 1 tablet (1,000 mg total) by mouth 3 (three) times daily. 21 tablet 0   No current facility-administered medications on file prior to visit.    No Known Allergies  Social History:  reports that she has never smoked. She has never used smokeless tobacco. She reports that she does not drink alcohol and does not use drugs.  Family History  Problem Relation Age of Onset   Cancer Maternal Grandfather  The following portions of the patient's history were reviewed and updated as appropriate: allergies, current medications, past family history, past medical history, past social history, past surgical history and problem list.  Review of Systems Pertinent items noted in HPI and remainder of comprehensive ROS otherwise negative.  Physical Exam:  There were no vitals taken for this visit. CONSTITUTIONAL: Well-developed, well-nourished female in no acute distress.  HENT:  Normocephalic, atraumatic, External right and left ear normal. Oropharynx is clear and moist EYES: Conjunctivae and EOM are normal. Pupils are equal, round, and reactive to light. No  scleral icterus.  NECK: Normal range of motion, supple, no masses.  Normal thyroid.  SKIN: Skin is warm and dry. No rash noted. Not diaphoretic. No erythema. No pallor. MUSCULOSKELETAL: Normal range of motion. No tenderness.  No cyanosis, clubbing, or edema.  2+ distal pulses. NEUROLOGIC: Alert and oriented to person, place, and time. Normal reflexes, muscle tone coordination.  PSYCHIATRIC: Normal mood and affect. Normal behavior. Normal judgment and thought content. CARDIOVASCULAR: Normal heart rate noted, regular rhythm RESPIRATORY: Clear to auscultation bilaterally. Effort and breath sounds normal, no problems with respiration noted. BREASTS: Symmetric in size. No masses, tenderness, skin changes, nipple drainage, or lymphadenopathy bilaterally.  ABDOMEN: Soft, no distention noted.  No tenderness, rebound or guarding.  PELVIC: Normal appearing external genitalia and urethral meatus; normal appearing vaginal mucosa and cervix.  No abnormal discharge noted.  Pap smear obtained, contact bleeding present.  Normal uterine size, no other palpable masses, no uterine or adnexal tenderness.  .   Assessment and Plan:    1. Women's annual routine gynecological examination . Pap:Will follow up results of pap smear and manage accordingly Mammogram : n/a  Labs: lipid profile, Hep c Refills:ocp Referral: none Routine preventative health maintenance measures emphasized. Please refer to After Visit Summary for other counseling recommendations.      Doreene Burke, CNM [facility],  [facility]

## 2020-04-04 NOTE — Patient Instructions (Signed)
Preventive Care 21-32 Years Old, Female Preventive care refers to visits with your health care provider and lifestyle choices that can promote health and wellness. This includes:  A yearly physical exam. This may also be called an annual well check.  Regular dental visits and eye exams.  Immunizations.  Screening for certain conditions.  Healthy lifestyle choices, such as eating a healthy diet, getting regular exercise, not using drugs or products that contain nicotine and tobacco, and limiting alcohol use. What can I expect for my preventive care visit? Physical exam Your health care provider will check your:  Height and weight. This may be used to calculate body mass index (BMI), which tells if you are at a healthy weight.  Heart rate and blood pressure.  Skin for abnormal spots. Counseling Your health care provider may ask you questions about your:  Alcohol, tobacco, and drug use.  Emotional well-being.  Home and relationship well-being.  Sexual activity.  Eating habits.  Work and work environment.  Method of birth control.  Menstrual cycle.  Pregnancy history. What immunizations do I need?  Influenza (flu) vaccine  This is recommended every year. Tetanus, diphtheria, and pertussis (Tdap) vaccine  You may need a Td booster every 10 years. Varicella (chickenpox) vaccine  You may need this if you have not been vaccinated. Human papillomavirus (HPV) vaccine  If recommended by your health care provider, you may need three doses over 6 months. Measles, mumps, and rubella (MMR) vaccine  You may need at least one dose of MMR. You may also need a second dose. Meningococcal conjugate (MenACWY) vaccine  One dose is recommended if you are age 19-21 years and a first-year college student living in a residence hall, or if you have one of several medical conditions. You may also need additional booster doses. Pneumococcal conjugate (PCV13) vaccine  You may need  this if you have certain conditions and were not previously vaccinated. Pneumococcal polysaccharide (PPSV23) vaccine  You may need one or two doses if you smoke cigarettes or if you have certain conditions. Hepatitis A vaccine  You may need this if you have certain conditions or if you travel or work in places where you may be exposed to hepatitis A. Hepatitis B vaccine  You may need this if you have certain conditions or if you travel or work in places where you may be exposed to hepatitis B. Haemophilus influenzae type b (Hib) vaccine  You may need this if you have certain conditions. You may receive vaccines as individual doses or as more than one vaccine together in one shot (combination vaccines). Talk with your health care provider about the risks and benefits of combination vaccines. What tests do I need?  Blood tests  Lipid and cholesterol levels. These may be checked every 5 years starting at age 20.  Hepatitis C test.  Hepatitis B test. Screening  Diabetes screening. This is done by checking your blood sugar (glucose) after you have not eaten for a while (fasting).  Sexually transmitted disease (STD) testing.  BRCA-related cancer screening. This may be done if you have a family history of breast, ovarian, tubal, or peritoneal cancers.  Pelvic exam and Pap test. This may be done every 3 years starting at age 21. Starting at age 30, this may be done every 5 years if you have a Pap test in combination with an HPV test. Talk with your health care provider about your test results, treatment options, and if necessary, the need for more tests.   Follow these instructions at home: Eating and drinking   Eat a diet that includes fresh fruits and vegetables, whole grains, lean protein, and low-fat dairy.  Take vitamin and mineral supplements as recommended by your health care provider.  Do not drink alcohol if: ? Your health care provider tells you not to drink. ? You are  pregnant, may be pregnant, or are planning to become pregnant.  If you drink alcohol: ? Limit how much you have to 0-1 drink a day. ? Be aware of how much alcohol is in your drink. In the [place] one drink equals one 12 oz bottle of beer (355 mL), one 5 oz glass of wine (148 mL), or one 1 oz glass of hard liquor (44 mL). Lifestyle  Take daily care of your teeth and gums.  Stay active. Exercise for at least 30 minutes on 5 or more days each week.  Do not use any products that contain nicotine or tobacco, such as cigarettes, e-cigarettes, and chewing tobacco. If you need help quitting, ask your health care provider.  If you are sexually active, practice safe sex. Use a condom or other form of birth control (contraception) in order to prevent pregnancy and STIs (sexually transmitted infections). If you plan to become pregnant, see your health care provider for a preconception visit. What's next?  Visit your health care provider once a year for a well check visit.  Ask your health care provider how often you should have your eyes and teeth checked.  Stay up to date on all vaccines. This information is not intended to replace advice given to you by your health care provider. Make sure you discuss any questions you have with your health care provider. Document Revised: 12/11/2017 Document Reviewed: 12/11/2017 Elsevier Patient Education  2020 [employer].

## 2020-04-05 LAB — LIPID PANEL
Chol/HDL Ratio: 2.6 ratio (ref 0.0–4.4)
Cholesterol, Total: 166 mg/dL (ref 100–199)
HDL: 65 mg/dL (ref >39)
LDL Chol Calc (NIH): 88 mg/dL (ref 0–99)
Triglycerides: 70 mg/dL (ref 0–149)
VLDL Cholesterol Cal: 13 mg/dL (ref 5–40)

## 2020-04-05 LAB — HEPATITIS C ANTIBODY: Hep C Virus Ab: <0.1 s/co ratio (ref 0.0–0.9)

## 2020-04-10 ENCOUNTER — Other Ambulatory Visit: Payer: Self-pay

## 2020-04-10 ENCOUNTER — Encounter: Payer: Self-pay | Admitting: Family Medicine

## 2020-04-10 ENCOUNTER — Ambulatory Visit (INDEPENDENT_AMBULATORY_CARE_PROVIDER_SITE_OTHER): Payer: BC Managed Care – PPO | Admitting: Family Medicine

## 2020-04-10 DIAGNOSIS — Z9109 Other allergy status, other than to drugs and biological substances: Secondary | ICD-10-CM

## 2020-04-10 DIAGNOSIS — J310 Chronic rhinitis: Secondary | ICD-10-CM

## 2020-04-10 DIAGNOSIS — K219 Gastro-esophageal reflux disease without esophagitis: Secondary | ICD-10-CM | POA: Diagnosis not present

## 2020-04-10 DIAGNOSIS — J329 Chronic sinusitis, unspecified: Secondary | ICD-10-CM

## 2020-04-10 HISTORY — DX: Gastro-esophageal reflux disease without esophagitis: K21.9

## 2020-04-10 MED ORDER — LORATADINE 10 MG PO TABS: 10.0000 mg | ORAL_TABLET | Freq: Every day | ORAL | 3 refills | Status: DC

## 2020-04-10 MED ORDER — PANTOPRAZOLE SODIUM 20 MG PO TBEC: 20.0000 mg | DELAYED_RELEASE_TABLET | ORAL | 0 refills | Status: DC

## 2020-04-10 MED ORDER — MONTELUKAST SODIUM 10 MG PO TABS: 10.0000 mg | ORAL_TABLET | Freq: Every day | ORAL | 3 refills | Status: DC

## 2020-04-10 NOTE — Progress Notes (Signed)
Name: Autumn Todd   MRN: [id number]    DOB: [birth year]   Date:04/10/2020       Progress Note  Subjective:    Chief Complaint  Chief Complaint  Patient presents with  . Allergic Rhinitis     Stuffy nose,     I connected with  Mellody Drown  on 04/10/20 at  3:00 PM EST by a video enabled telemedicine application and verified that I am speaking with the correct person using two identifiers.  I discussed the limitations of evaluation and management by telemedicine and the availability of in person appointments. The patient expressed understanding and agreed to proceed. Staff also discussed with the patient that there may be a patient responsible charge related to this service. Patient Location: home Provider Location: [facility]  Additional Individuals present: none  HPI Nasal sx/allergies: she request refills on prior medications, she has recently tried restarting Xyzal for about 2 weeks but it did not make much of a difference to her nasal symptoms.  With her pregnancy she had to stop Singulair but that worked very well for her.  She is not pregnant, on oral birth control, no longer nursing. She denies any headache, sore throat, fever.  She has had a few colds or viral illnesses over the past 3 months but has felt better recently except for changing weather and nasal allergies.  GERD:  Worse with pregnancy and it calmed down after delivery, but feels like it returned recently, some indigestion/pain.   Located central chest to epigastric area, was managing with famotidine    Patient Active Problem List   Diagnosis Date Noted  . Labor and delivery, indication for care 06/06/2019  . History of oligohydramnios in prior pregnancy, currently pregnant 02/11/2019  . Previous cesarean section 02/11/2019  . Maternal varicella, non-immune 02/11/2019  . Declines vaginal birth after cesarean trial 02/11/2019  . Anxiety during pregnancy 12/23/2018  . Environmental allergies 09/29/2018  .  Anxiety 03/11/2018  . Aortic valve regurgitation 10/01/2017  . Mitral regurgitation 10/01/2017  . 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 De Quervain's tenosynovitis, left 06/06/2015  . Eczema of right hand 06/06/2015  . Asthma, mild intermittent 10/11/2014    Social History   Tobacco Use  . Smoking status: Never Smoker  . Smokeless tobacco: Never Used  Substance Use Topics  . Alcohol use: No    Alcohol/week: 0.0 standard drinks     Current Outpatient Medications:  .  Norethindrone-Ethinyl Estradiol-Fe Biphas (LO LOESTRIN FE) 1 MG-10 MCG / 10 MCG tablet, Take 1 tablet by mouth daily., Disp: 30 tablet, Rfl: 11 .  valACYclovir (VALTREX) 1000 MG tablet, Take 1 tablet (1,000 mg total) by mouth 3 (three) times daily., Disp: 21 tablet, Rfl: 0  No Known Allergies  I personally reviewed active problem list, medication list, allergies, family history, social history, health maintenance, notes from last encounter, lab results, imaging with the patient/caregiver today.   Review of Systems  10 Systems reviewed and are negative for acute change except as noted in the HPI.   Objective:   Virtual encounter, vitals limited, only able to obtain the following There were no vitals filed for this visit. There is no height or weight on file to calculate BMI. Nursing Note and Vital Signs reviewed.  Physical Exam Vitals and nursing note reviewed.  Neck:     Trachea: Phonation normal.  Neurological:     Mental Status: She is alert.  Psychiatric:  Mood and Affect: Mood normal.        Behavior: Behavior normal.     PE limited by telephone encounter  No results found for this or any previous visit (from the past 72 hour(s)).  Assessment and Plan:     ICD-10-CM   1. Rhinosinusitis  J31.0 loratadine (CLARITIN) 10 MG tablet   J32.9 montelukast (SINGULAIR) 10 MG tablet   restart singulair - not pregnant and not nursing, take with loratadine or  other antihistamine  2. Environmental allergies  Z91.09   3. Gastroesophageal reflux disease, unspecified whether esophagitis present  K21.9    epigastric to central chest discomfort - suspected GERD - using OTC pepcid, but sx have changed a little, PPI 2 week trial, continue diet/lifestyle efforts     -Red flags and when to present for emergency care or RTC including fever >101.28F, chest pain, shortness of breath, new/worsening/un-resolving symptoms, reviewed with patient at time of visit. Follow up and care instructions discussed and provided in AVS. - I discussed the assessment and treatment plan with the patient. The patient was provided an opportunity to ask questions and all were answered. The patient agreed with the plan and demonstrated an understanding of the instructions.  I provided 20 minutes of non-face-to-face time during this encounter.  Danelle Berry, PA-C 04/10/20 3:03 PM

## 2020-04-11 LAB — CYTOLOGY - PAP
Comment: NEGATIVE
Diagnosis: NEGATIVE
High risk HPV: NEGATIVE

## 2020-05-08 ENCOUNTER — Other Ambulatory Visit: Payer: Self-pay

## 2020-05-08 MED ORDER — PANTOPRAZOLE SODIUM 20 MG PO TBEC
20.0000 mg | DELAYED_RELEASE_TABLET | ORAL | 0 refills | Status: DC
Start: 2020-05-08 — End: 2021-04-03

## 2020-06-22 ENCOUNTER — Other Ambulatory Visit: Payer: Self-pay | Admitting: Certified Nurse Midwife

## 2021-03-30 ENCOUNTER — Other Ambulatory Visit: Payer: Self-pay | Admitting: Family Medicine

## 2021-03-30 DIAGNOSIS — J310 Chronic rhinitis: Secondary | ICD-10-CM

## 2021-04-03 ENCOUNTER — Ambulatory Visit: Payer: BC Managed Care – PPO | Admitting: Internal Medicine

## 2021-04-03 ENCOUNTER — Encounter: Payer: Self-pay | Admitting: Internal Medicine

## 2021-04-03 VITALS — BP 112/62 | HR 102 | Temp 97.7°F | Resp 16 | Ht 60.0 in | Wt 149.5 lb

## 2021-04-03 DIAGNOSIS — K219 Gastro-esophageal reflux disease without esophagitis: Secondary | ICD-10-CM | POA: Diagnosis not present

## 2021-04-03 DIAGNOSIS — J310 Chronic rhinitis: Secondary | ICD-10-CM

## 2021-04-03 DIAGNOSIS — J329 Chronic sinusitis, unspecified: Secondary | ICD-10-CM | POA: Diagnosis not present

## 2021-04-03 MED ORDER — CETIRIZINE HCL 10 MG PO TABS: 10.0000 mg | ORAL_TABLET | Freq: Every day | ORAL | 11 refills | Status: DC

## 2021-04-03 MED ORDER — MONTELUKAST SODIUM 10 MG PO TABS: 10.0000 mg | ORAL_TABLET | Freq: Every day | ORAL | 3 refills | Status: DC

## 2021-04-03 MED ORDER — PANTOPRAZOLE SODIUM 20 MG PO TBEC
20.0000 mg | DELAYED_RELEASE_TABLET | Freq: Every day | ORAL | 0 refills | Status: DC
Start: 2021-04-03 — End: 2021-05-01

## 2021-04-03 NOTE — Progress Notes (Signed)
Established Patient Office Visit  Subjective:  Patient ID: Autumn Todd, female    DOB: [birth year]  Age: 35 y.o. MRN: [id number]  CC:  Chief Complaint  Patient presents with   Medication Refill   Allergic Rhinitis     HPI Anavae Racy presents for follow up on chronic medical conditions.   Allergic Rhinitis: -She is currently on Claritin and Singulair daily, symptoms are worse in the winter -Has noticed that Claritin isn't working as well as it had in the past  GERD: -No longer taking PPI daily, about once a month for acid reflux symptoms  Health Maintenance: -Pap 12/21 negative, currently on Lo Loestrin OCP from gyn  Past Medical History:  Diagnosis Date   Allergy    Anemia 07/24/2015   Anxiety 03/11/2018   Anxiety 03/11/2018   Asthma    not in four years   De Quervain's tenosynovitis, left 06/06/2015   Eczema of right hand 06/06/2015   Gastroesophageal reflux disease 04/10/2020   Sinus tachycardia     Past Surgical History:  Procedure Laterality Date   CESAREAN SECTION N/A 04/27/2015   Procedure: CESAREAN SECTION;  Surgeon: Aletha Halim, MD;  Location: [facility] ORS;  Service: Obstetrics;  Laterality: N/A;   CESAREAN SECTION N/A 06/06/2019   Procedure: CESAREAN SECTION;  Surgeon: Harlin Heys, MD;  Location: [facility] ORS;  Service: Obstetrics;  Laterality: N/A;   WISDOM TOOTH EXTRACTION      Family History  Problem Relation Age of Onset   Cancer Maternal Grandfather     Social History   Socioeconomic History   Marital status: Married    Spouse name: Not on file   Number of children: 0   Years of education: Not on file   Highest education level: Not on file  Occupational History   Occupation: Teacher    Comment: [employer]  Tobacco Use   Smoking status: Never   Smokeless tobacco: Never  Vaping Use   Vaping Use: Never used  Substance and Sexual Activity   Alcohol use: No    Alcohol/week: 0.0 standard drinks   Drug use: No   Sexual activity: Yes     Partners: Male    Birth control/protection: Pill  Other Topics Concern   Not on file  Social History Narrative   Not on file   Social Determinants of Health   Financial Resource Strain: Not on file  Food Insecurity: Not on file  Transportation Needs: Not on file  Physical Activity: Not on file  Stress: Not on file  Social Connections: Not on file  Intimate Partner Violence: Not on file    Outpatient Medications Prior to Visit  Medication Sig Dispense Refill   LO LOESTRIN FE 1 MG-10 MCG / 10 MCG tablet TAKE 1 TABLET BY MOUTH DAILY 28 tablet 10   loratadine (CLARITIN) 10 MG tablet Take 1 tablet (10 mg total) by mouth daily. 90 tablet 3   montelukast (SINGULAIR) 10 MG tablet Take 1 tablet (10 mg total) by mouth at bedtime. 90 tablet 3   pantoprazole (PROTONIX) 20 MG tablet Take 1 tablet (20 mg total) by mouth every morning for 14 days. One hour before breakfast 90 tablet 0   No facility-administered medications prior to visit.    No Known Allergies  ROS Review of Systems  Constitutional:  Negative for chills and fever.  HENT:  Negative for congestion, postnasal drip, rhinorrhea, sinus pain and sore throat.   Eyes:  Negative for visual disturbance.  Respiratory:  Negative for  cough and shortness of breath.   Cardiovascular:  Negative for chest pain.  Gastrointestinal:  Negative for abdominal pain.     Objective:    Physical Exam Constitutional:      Appearance: Normal appearance.  HENT:     Head: Normocephalic and atraumatic.     Nose: Nose normal.     Mouth/Throat:     Mouth: Mucous membranes are moist.     Pharynx: Oropharynx is clear.  Eyes:     Conjunctiva/sclera: Conjunctivae normal.  Cardiovascular:     Rate and Rhythm: Normal rate and regular rhythm.  Pulmonary:     Effort: Pulmonary effort is normal.     Breath sounds: Normal breath sounds.  Musculoskeletal:     Right lower leg: No edema.     Left lower leg: No edema.  Skin:    General: Skin is  warm and dry.  Neurological:     General: No focal deficit present.     Mental Status: She is alert. Mental status is at baseline.  Psychiatric:        Mood and Affect: Mood normal.        Behavior: Behavior normal.    BP 112/62    Pulse (!) 102    Temp 97.7 F (36.5 C)    Resp 16    Ht 5' (1.524 m)    Wt 149 lb 8 oz (67.8 kg)    LMP 03/05/2021    SpO2 98%    BMI 29.20 kg/m  Wt Readings from Last 3 Encounters:  04/04/20 149 lb 7 oz (67.8 kg)  07/26/19 153 lb 1 oz (69.4 kg)  06/14/19 173 lb 8 oz (78.7 kg)     Health Maintenance Due  Topic Date Due   Pneumococcal Vaccine 25-13 Years old (1 - PCV) Never done   COVID-19 Vaccine (3 - Booster for Moderna series) 09/21/2019   INFLUENZA VACCINE  11/13/2020    There are no preventive care reminders to display for this patient.  Lab Results  Component Value Date   TSH 1.370 11/23/2018   Lab Results  Component Value Date   WBC 10.5 06/06/2019   HGB 11.4 (L) 06/06/2019   HCT 35.2 (L) 06/06/2019   MCV 88.9 06/06/2019   PLT 252 06/06/2019   Lab Results  Component Value Date   NA 139 02/13/2018   K 4.1 02/13/2018   CO2 26 02/13/2018   GLUCOSE 97 02/13/2018   BUN 11 02/13/2018   CREATININE 0.76 02/13/2018   BILITOT 0.4 02/13/2018   AST 15 02/13/2018   ALT 13 02/13/2018   PROT 7.2 02/13/2018   CALCIUM 9.7 02/13/2018   Lab Results  Component Value Date   CHOL 166 04/04/2020   Lab Results  Component Value Date   HDL 65 04/04/2020   Lab Results  Component Value Date   LDLCALC 88 04/04/2020   Lab Results  Component Value Date   TRIG 70 04/04/2020   Lab Results  Component Value Date   CHOLHDL 2.6 04/04/2020   Lab Results  Component Value Date   HGBA1C 4.9 11/23/2018      Assessment & Plan:   1. Rhinosinusitis: Discussed how oral anti-histamines can stop being as effective if taking for long periods of time. Agreeable to switch to Zyrtec, we discussed how this could make her more drowsy and she will take it  at night. Refilled Singulair today as well. Follow up in 1 year or sooner as needed.  - montelukast (SINGULAIR)  10 MG tablet; Take 1 tablet (10 mg total) by mouth at bedtime.  Dispense: 90 tablet; Refill: 3 - cetirizine (ZYRTEC) 10 MG tablet; Take 1 tablet (10 mg total) by mouth daily.  Dispense: 30 tablet; Refill: 11  2. Gastroesophageal reflux disease, unspecified whether esophagitis present: Stable, only taking PPI about once a month for acid reflux symptoms.   - pantoprazole (PROTONIX) 20 MG tablet; Take 1 tablet (20 mg total) by mouth daily. One hour before breakfast  Dispense: 30 tablet; Refill: 0   Follow-up: Return in about 1 year (around 04/03/2022).    Teodora Medici, DO

## 2021-04-03 NOTE — Patient Instructions (Signed)
It was great seeing you today!  Plan discussed at today's visit: -Switch from Claritin to Zytrec, continue Singulair -Can take Protonix as needed for acid reflux   Follow up in: 1 year   Take care and let us know if you have any questions or concerns prior to your next visit.  Dr. Caralee Ates

## 2021-04-09 ENCOUNTER — Encounter: Payer: Self-pay | Admitting: Certified Nurse Midwife

## 2021-04-10 ENCOUNTER — Encounter: Payer: Self-pay | Admitting: Certified Nurse Midwife

## 2021-04-29 ENCOUNTER — Other Ambulatory Visit: Payer: Self-pay | Admitting: Certified Nurse Midwife

## 2021-05-01 ENCOUNTER — Other Ambulatory Visit: Payer: Self-pay | Admitting: Internal Medicine

## 2021-05-01 DIAGNOSIS — K219 Gastro-esophageal reflux disease without esophagitis: Secondary | ICD-10-CM

## 2021-05-01 NOTE — Telephone Encounter (Signed)
Requested Prescriptions  Pending Prescriptions Disp Refills   pantoprazole (PROTONIX) 20 MG tablet [Pharmacy Med Name: PANTOPRAZOLE 20MG  TABLETS] 30 tablet 3    Sig: TAKE 1 TABLET(20 MG) BY MOUTH DAILY 1 HOUR BEFORE BREAKFAST     Gastroenterology: Proton Pump Inhibitors Passed - 05/01/2021  8:18 AM      Passed - Valid encounter within last 12 months    Recent Outpatient Visits          4 weeks ago Rhinosinusitis   [facility], DO   1 year ago Rhinosinusitis   [facility], PA-C   1 year ago Herpes zoster without complication   [facility], MD   2 years ago Anxiety during pregnancy   [facility], FNP   2 years ago Anxiety   [facility] [address] [place]

## 2021-07-23 ENCOUNTER — Other Ambulatory Visit: Payer: Self-pay | Admitting: Certified Nurse Midwife

## 2021-08-02 ENCOUNTER — Encounter: Payer: Self-pay | Admitting: Internal Medicine

## 2021-08-02 ENCOUNTER — Ambulatory Visit: Payer: BC Managed Care – PPO | Admitting: Internal Medicine

## 2021-08-02 VITALS — BP 116/70 | HR 111 | Temp 98.0°F | Resp 16 | Ht 60.0 in | Wt 152.7 lb

## 2021-08-02 DIAGNOSIS — M545 Low back pain, unspecified: Secondary | ICD-10-CM

## 2021-08-02 DIAGNOSIS — S76311A Strain of muscle, fascia and tendon of the posterior muscle group at thigh level, right thigh, initial encounter: Secondary | ICD-10-CM | POA: Diagnosis not present

## 2021-08-02 MED ORDER — NAPROXEN 500 MG PO TABS: 500.0000 mg | ORAL_TABLET | Freq: Two times a day (BID) | ORAL | 0 refills | Status: AC

## 2021-08-02 MED ORDER — TIZANIDINE HCL 4 MG PO TABS
4.0000 mg | ORAL_TABLET | Freq: Four times a day (QID) | ORAL | 0 refills | Status: DC | PRN
Start: 2021-08-02 — End: 2021-10-10

## 2021-08-02 NOTE — Progress Notes (Signed)
? ?Acute Office Visit ? ?Subjective:  ? ?  ?Patient ID: Autumn Todd, female    DOB: Sep 05, 1985, 36 y.o.   MRN: [id number] ? ?Chief Complaint  ?Patient presents with  ? Back Pain  ?  From working out  ? ? ?HPI ?Patient is in today for lower back pain. Started working out in [employer], at first had some lumbar back pain. Happened again in February for about 2 weeks. Ibuprofen did not help.  ? ?BACK PAIN ?Duration:  On and off since December ?Mechanism of injury:  No known mechanism of injury, started working out more regularly in December at the time of initial pain ?Location: low back ?Onset: gradual ?Severity: moderate ?Quality: dull, aching, and pulling ?Frequency: intermittent ?Radiation: R leg above the knee ?Aggravating factors:  Working out, stretching leg forward, bending over to touch toes all makes pain in the back of her leg worse ?Alleviating factors: rest and heat ?Status: fluctuating ?Treatments attempted: rest, heat, and ibuprofen  ?Relief with NSAIDs?: no ?Nighttime pain:  no ?Paresthesias / decreased sensation:  no ? ?Review of Systems  ?Constitutional:  Negative for chills and fever.  ?Musculoskeletal:  Positive for back pain.  ?Neurological:  Negative for tingling, sensory change and weakness.  ? ? ?   ?Objective:  ?  ?BP 116/70   Pulse (!) 111   Temp 98 ?F (36.7 ?C)   Resp 16   Ht 5' (1.524 m)   Wt 152 lb 11.2 oz (69.3 kg)   SpO2 99%   BMI 29.82 kg/m?  ?BP Readings from Last 3 Encounters:  ?08/02/21 116/70  ?04/03/21 112/62  ?04/04/20 116/69  ? ?Wt Readings from Last 3 Encounters:  ?08/02/21 152 lb 11.2 oz (69.3 kg)  ?04/03/21 149 lb 8 oz (67.8 kg)  ?04/04/20 149 lb 7 oz (67.8 kg)  ? ?  ? ?Physical Exam ?Constitutional:   ?   Appearance: Normal appearance.  ?HENT:  ?   Head: Normocephalic and atraumatic.  ?Eyes:  ?   Conjunctiva/sclera: Conjunctivae normal.  ?Cardiovascular:  ?   Rate and Rhythm: Normal rate and regular rhythm.  ?Pulmonary:  ?   Effort: Pulmonary effort is normal.  ?    Breath sounds: Normal breath sounds.  ?Musculoskeletal:     ?   General: Tenderness present.  ?   Right lower leg: No edema.  ?   Left lower leg: No edema.  ?   Comments: No back pain to palpation, but pain pulling down back of right leg with flexion of lumbar spine.  Full range of motion with extension, bilateral sidebending and bilateral rotation of lumbar spine.  Right straight leg test positive, hamstrings tight bilaterally but more so on right.  ?Skin: ?   General: Skin is warm and dry.  ?Neurological:  ?   General: No focal deficit present.  ?   Mental Status: She is alert. Mental status is at baseline.  ?Psychiatric:     ?   Mood and Affect: Mood normal.     ?   Behavior: Behavior normal.  ? ?   ?Assessment & Plan:  ? ?1. Hamstring strain, right, initial encounter/Lumbar back pain: Physical exam consistent with right hamstring strain.  She states she only does a brief stretching/warm up prior to her workouts.  We discussed the importance of stretching both before and after workout to help prevent injury. She was given specific hamstring stretches to use. We will treat with scheduled anti-inflammatories with naproxen 500 mg twice daily for  10 days and a muscle relaxer to take as needed for muscle strain.  Also discussed other conservative treatments like moist heat.  Follow-up if symptoms worsen or fail to improve. ? ?- tiZANidine (ZANAFLEX) 4 MG tablet; Take 1 tablet (4 mg total) by mouth every 6 (six) hours as needed for muscle spasms.  Dispense: 30 tablet; Refill: 0 ?- naproxen (NAPROSYN) 500 MG tablet; Take 1 tablet (500 mg total) by mouth 2 (two) times daily with a meal for 10 days.  Dispense: 20 tablet; Refill: 0 ? ?Return if symptoms worsen or fail to improve. ? ?Teodora Medici, DO ? ? ?

## 2021-08-02 NOTE — Patient Instructions (Addendum)
It was great seeing you today! ? ?Plan discussed at today's visit: ?-Naproxen 500 mg to take twice a day scheduled for the next 10 days. Do NOT take any other anti-inflammatories with this medication and take with food ?-Muscle relaxer to take at night, it can make you sleepy.  ?-Work on hamstring stretches and gradually work up to time to prevent injury  ? ?Follow up in: as needed  ? ?Take care and let us know if you have any questions or concerns prior to your next visit. ? ?Dr. Caralee Ates ? ?Hamstring Strain Rehab ?Ask your health care provider which exercises are safe for you. Do exercises exactly as told by your health care provider and adjust them as directed. It is normal to feel mild stretching, pulling, tightness, or discomfort as you do these exercises. Stop right away if you feel sudden pain or your pain gets worse. Do not begin these exercises until told by your health care provider. ?Stretching and range-of-motion exercises ?These exercises warm up your muscles and joints and improve the movement and flexibility of your thighs. These exercises also help to relieve pain, numbness, and tingling. Talk to your health care provider about these restrictions. ?Knee extension, seated ? ?Sit with your left / right heel propped on a chair, a coffee table, or a footstool. Do not have anything under your knee to support it. ?Allow your leg muscles to relax, letting gravity straighten out your knee (extension). You should feel a stretch behind your left / right knee. ?If told by your health care provider, deepen the stretch by placing a __________ weight on your thigh, just above your kneecap. ?Hold this position for __________ seconds. ?Repeat __________ times. Complete this exercise __________ times a day. ?Seated stretch ?This exercise is sometimes called hamstrings and adductors stretch. ?Sit on the floor with your legs stretched wide. Keep your knees straight during this exercise. ?Keeping your head and back in a  straight line, bend at your waist to reach for your left foot. You should feel a stretch in your right inner thigh (adductors). ?Hold this position for __________ seconds. Then slowly return to the upright position. ?Keeping your head and back in a straight line, bend at your waist to reach forward. You should feel a stretch behind both of your thighs or knees (hamstrings). ?Hold this position for __________ seconds. Then slowly return to the upright position. ?Keeping your head and back in a straight line, bend at your waist to reach for your right foot. You should feel a stretch in your left inner thigh (adductors). ?Hold this position for __________ seconds. Then slowly return to the upright position. ?Repeat __________ times. Complete this exercise __________ times a day. ?Hamstrings stretch, supine ? ?Lie on your back (supine position). ?Loop a belt or towel over the ball of your left / right foot. The ball of your foot is on the walking surface, right under your toes. ?Straighten your left / right knee and slowly pull on the belt or towel to raise your leg. ?Do not let your left / right knee bend while you do this. ?Keep your other leg flat on the floor. ?Raise the left / right leg until you feel a gentle stretch behind your left / right knee or thigh (hamstrings). ?Hold this position for __________ seconds. ?Slowly return your leg to the starting position. ?Repeat __________ times. Complete this exercise __________ times a day. ?Strengthening exercises ?These exercises build strength and endurance in your thighs. Endurance is the  ability to use your muscles for a long time, even after they get tired. ?Straight leg raises, prone ?This exercise strengthens the muscles that move the hips (hip extensors). ?Lie on your abdomen on a firm surface (prone position). ?Tense the muscles in your buttocks and lift your left / right leg about 4 inches (10 cm). Keep your knee straight as you lift your leg. If you cannot  lift your leg that high without arching your back, place a pillow under your hips. ?Hold the position for __________ seconds. ?Slowly lower your leg to the starting position. ?Allow your muscles to relax completely before you start the next repetition. ?Repeat __________ times. Complete this exercise __________ times a day. ?Bridge ?This exercise strengthens the muscles in your buttocks and the back of your thighs (hip extensors). ?Lie on your back on a firm surface with your knees bent and your feet flat on the floor. ?Tighten your buttocks muscles and lift your bottom off the floor until the trunk of your body is level with your thighs. ?You should feel the muscles working in your buttocks and the back of your thighs. ?Do not arch your back. ?Hold this position for __________ seconds. ?Slowly lower your hips to the starting position. ?Let your buttocks muscles relax completely between repetitions. ?If told by your health care provider, keep your bottom lifted off the floor while you slowly walk your feet away from you as far as you can control. Hold for __________ seconds, then slowly walk your feet back toward you. ?Repeat __________ times. Complete this exercise __________ times a day. ?Lateral walking with band ?This is an exercise in which you walk sideways (lateral), with tension provided by an exercise band. The exercise strengthens the muscles in your hip (hip abductors). ?Stand in a long hallway. ?Wrap a loop of exercise band around your legs, just above your knees. ?Bend your knees gently and drop your hips down and back so your weight is over your heels. ?Step to the side to move down the length of the hallway, keeping your toes pointed ahead of you and keeping tension in the band. ?Repeat, leading with your other leg. ?Repeat __________ times. Complete this exercise __________ times a day. ?Single leg stand with reaching ?This exercise is also called eccentric hamstring stretch. ?Stand on your left /  right foot. Keep your big toe down on the floor and try to keep your arch lifted. ?Slowly reach down toward the floor as far as you can while keeping your balance. Lowering your thigh under tension is called eccentric stretching. ?Hold this position for __________ seconds. ?Repeat __________ times. Complete this exercise __________ times a day. ?Plank, prone ?This exercise strengthens muscles in your abdomen and core area. ?Lie on your abdomen on the floor (prone position),and prop yourself up on your elbows. Your hands should be straight out in front of you, and your elbows should be below your shoulders. Position your feet similar to a push-up position so your toes are on the ground. ?Tighten your abdominal muscles and lift your body off the floor. ?Do not arch your back. ?Do not hold your breath. ?Hold this position for __________ seconds. ?Repeat __________ times. Complete this exercise __________ times a day. ?This information is not intended to replace advice given to you by your health care provider. Make sure you discuss any questions you have with your health care provider. ?Document Revised: 09/25/2020 Document Reviewed: 09/25/2020 ?Elsevier Patient Education ? 2023 [employer] ?

## 2021-08-06 ENCOUNTER — Other Ambulatory Visit: Payer: Self-pay | Admitting: Internal Medicine

## 2021-08-06 DIAGNOSIS — S76311A Strain of muscle, fascia and tendon of the posterior muscle group at thigh level, right thigh, initial encounter: Secondary | ICD-10-CM

## 2021-08-06 DIAGNOSIS — M545 Low back pain, unspecified: Secondary | ICD-10-CM

## 2021-08-07 ENCOUNTER — Other Ambulatory Visit: Payer: Self-pay

## 2021-08-07 NOTE — Telephone Encounter (Signed)
Requested medication (s) are due for refill today: no  ? ?Requested medication (s) are on the active medication list: yes ? ?Last refill:  08/02/21 ? ?Future visit scheduled: yes ? ?Notes to clinic:  Unable to refill per protocol, cannot delegate. Medication was refilled 08/02/21 by PCP, possible duplicate request. ? ? ? ?  ?Requested Prescriptions  ?Pending Prescriptions Disp Refills  ? tiZANidine (ZANAFLEX) 4 MG tablet [Pharmacy Med Name: TIZANIDINE 4MG  TABLETS] 30 tablet 0  ?  Sig: TAKE 1 TABLET(4 MG) BY MOUTH EVERY 6 HOURS AS NEEDED FOR MUSCLE SPASMS  ?  ? Not Delegated - Cardiovascular:  Alpha-2 Agonists - tizanidine Failed - 08/06/2021  3:36 AM  ?  ?  Failed - This refill cannot be delegated  ?  ?  Passed - Valid encounter within last 6 months  ?  Recent Outpatient Visits   ? ?      ? 5 days ago Hamstring strain, right, initial encounter  ? [place], DO  ? 4 months ago Rhinosinusitis  ? [facility] Teodora Medici, DO  ? 1 year ago Rhinosinusitis  ? [facility] Delsa Grana, PA-C  ? 1 year ago Herpes zoster without complication  ? [facility] Lebron Conners D, MD  ? 2 years ago Anxiety during pregnancy  ? [facility] [place], [place]  ? ?  ?  ? ? ?  ?  ?  ? ? ?

## 2021-08-09 ENCOUNTER — Other Ambulatory Visit: Payer: Self-pay | Admitting: Internal Medicine

## 2021-08-09 DIAGNOSIS — S76311A Strain of muscle, fascia and tendon of the posterior muscle group at thigh level, right thigh, initial encounter: Secondary | ICD-10-CM

## 2021-08-09 DIAGNOSIS — M545 Low back pain, unspecified: Secondary | ICD-10-CM

## 2021-08-09 NOTE — Telephone Encounter (Signed)
Requested medications are due for refill today.  unsure ? ?Requested medications are on the active medications list.  yes ? ?Last refill. 08/02/2021 #20 ? ?Future visit scheduled.   no ? ?Notes to clinic.  Please review for refill. ? ? ? ?Requested Prescriptions  ?Pending Prescriptions Disp Refills  ? naproxen (NAPROSYN) 500 MG tablet [Pharmacy Med Name: NAPROXEN 500MG TABLETS] 20 tablet 0  ?  Sig: TAKE 1 TABLET(500 MG) BY MOUTH TWICE DAILY WITH A MEAL FOR 10 DAYS  ?  ? Analgesics:  NSAIDS Failed - 08/09/2021  3:35 AM  ?  ?  Failed - Manual Review: Labs are only required if the patient has taken medication for more than 8 weeks.  ?  ?  Failed - Cr in normal range and within 360 days  ?  Creat  ?Date Value Ref Range Status  ?02/13/2018 0.76 0.50 - 1.10 mg/dL Final  ?  ?  ?  ?  Failed - HGB in normal range and within 360 days  ?  Hemoglobin  ?Date Value Ref Range Status  ?06/06/2019 11.4 (L) 12.0 - 15.0 g/dL Final  ?04/05/2019 11.1 11.1 - 15.9 g/dL Final  ?  ?  ?  ?  Failed - PLT in normal range and within 360 days  ?  Platelets  ?Date Value Ref Range Status  ?06/06/2019 252 150 - 400 K/uL Final  ?04/05/2019 277 150 - 450 x10E3/uL Final  ?  ?  ?  ?  Failed - HCT in normal range and within 360 days  ?  HCT  ?Date Value Ref Range Status  ?06/06/2019 35.2 (L) 36.0 - 46.0 % Final  ? ?Hematocrit  ?Date Value Ref Range Status  ?04/05/2019 33.0 (L) 34.0 - 46.6 % Final  ?  ?  ?  ?  Failed - eGFR is 30 or above and within 360 days  ?  GFR, Est African American  ?Date Value Ref Range Status  ?02/13/2018 123 > OR = 60 mL/min/1.30m Final  ? ?GFR, Est Non African American  ?Date Value Ref Range Status  ?02/13/2018 106 > OR = 60 mL/min/1.753mFinal  ?  ?  ?  ?  Passed - Patient is not pregnant  ?  ?  Passed - Valid encounter within last 12 months  ?  Recent Outpatient Visits   ? ?      ? 1 week ago Hamstring strain, right, initial encounter  ? [facility]  ? 4 months ago [facility]  [facility]  ? 1 year ago [facility]osin[facility]  [facility]  ? 1 year ago [facility]oster without complication  [facility]  ? 2 years ago [facility]xiety during [facility]  ? [facility]? ?  ?  ? ? ?  ?  ?  ?  ?

## 2021-10-10 ENCOUNTER — Encounter: Payer: Self-pay | Admitting: Internal Medicine

## 2021-10-10 DIAGNOSIS — S76311A Strain of muscle, fascia and tendon of the posterior muscle group at thigh level, right thigh, initial encounter: Secondary | ICD-10-CM

## 2021-10-10 DIAGNOSIS — M545 Low back pain, unspecified: Secondary | ICD-10-CM

## 2021-10-10 MED ORDER — TIZANIDINE HCL 4 MG PO TABS: 4.0000 mg | ORAL_TABLET | Freq: Four times a day (QID) | ORAL | 0 refills | Status: DC | PRN

## 2021-10-12 ENCOUNTER — Ambulatory Visit: Payer: BC Managed Care – PPO | Admitting: Physician Assistant

## 2021-10-12 ENCOUNTER — Encounter: Payer: Self-pay | Admitting: Physician Assistant

## 2021-10-12 VITALS — BP 106/62 | HR 101 | Temp 97.8°F | Resp 16 | Ht 60.0 in | Wt 153.9 lb

## 2021-10-12 DIAGNOSIS — M79651 Pain in right thigh: Secondary | ICD-10-CM | POA: Diagnosis not present

## 2021-10-12 DIAGNOSIS — M545 Low back pain, unspecified: Secondary | ICD-10-CM | POA: Diagnosis not present

## 2021-10-12 MED ORDER — MELOXICAM 15 MG PO TABS: 15.0000 mg | ORAL_TABLET | Freq: Every day | ORAL | 1 refills | Status: DC

## 2021-10-12 NOTE — Progress Notes (Signed)
Established Patient Office Visit  Name: Autumn Todd   MRN: [id number]    DOB: [birth year]   Date:10/12/2021  Today's Provider: Jacquelin Hawking, MHS, PA-C Introduced myself to the patient as a PA-C and provided education on APPs in clinical practice.         Subjective  Chief Complaint  Chief Complaint  Patient presents with   Leg Pain   Back Pain   Follow-up    Right side continuous pain for months.    Leg Pain  Pertinent negatives include no tingling.  Back Pain Associated symptoms include leg pain. Pertinent negatives include no tingling or weakness.    Reports she right thigh and right lower back pain States there was some improvement since first seen but has noticed decrease in symptoms prior to exacerbation Started hurting again last Wed and thurs morning States leg seems to have gotten worse again Sunday and Monday  Back started hurting Wed Reports some relief with Ibuprofen   She tried taking Zanaflex but is skeptical of benefit  She states she was doing stretches for some time after apt but has not done them in almost a month  Location: along posterior of right thigh and lower back/ buttocks Alleviating: unsure what makes it better. States she was riding in car on Wed and it was aggravated.Seems to improve with more movement  Aggravating: Sitting for prolonged times, standing seems to aggravate it when she first ambulates.  Interventions: naproxen and zanaflex -limited relief with zanaflex but improvement with NSAID Pain level: 8/10 on Wed, 6/10 in thigh area on avg Pain character: feels like a stretching/ straining pain   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   Asthma, mild intermittent 10/11/2014    Past  Surgical History:  Procedure Laterality Date   CESAREAN SECTION N/A 04/27/2015   Procedure: CESAREAN SECTION;  Surgeon: Salisbury Bing, MD;  Location: [facility] ORS;  Service: Obstetrics;  Laterality: N/A;   CESAREAN SECTION N/A 06/06/2019   Procedure: CESAREAN SECTION;  Surgeon: Linzie Collin, MD;  Location: [facility] ORS;  Service: Obstetrics;  Laterality: N/A;   WISDOM TOOTH EXTRACTION      Family History  Problem Relation Age of Onset   Cancer Maternal Grandfather     Social History   Tobacco Use   Smoking status: Never   Smokeless tobacco: Never  Substance Use Topics   Alcohol use: No    Alcohol/week: 0.0 standard drinks of alcohol     Current Outpatient Medications:    cetirizine (ZYRTEC) 10 MG tablet, Take 1 tablet (10 mg total) by mouth daily., Disp: 30 tablet, Rfl: 11   LO LOESTRIN FE 1 MG-10 MCG / 10 MCG tablet, TAKE 1 TABLET BY MOUTH DAILY, Disp: 28 tablet, Rfl: 10   montelukast (SINGULAIR) 10 MG tablet, Take 1 tablet (10 mg total) by mouth at bedtime., Disp: 90 tablet, Rfl: 3   tiZANidine (ZANAFLEX) 4 MG tablet, Take 1 tablet (4 mg total) by mouth every 6 (six) hours as needed for muscle spasms., Disp: 30 tablet, Rfl: 0  No Known Allergies  I personally reviewed active problem list, medication list, allergies, notes from last encounter, lab results with the patient/caregiver today.   Review of Systems  Musculoskeletal:  Positive for back pain  and myalgias. Negative for falls.  Neurological:  Negative for tingling, tremors and weakness.      Objective  Vitals:   10/12/21 1119  BP: 106/62  Pulse: (!) 101  Resp: 16  Temp: 97.8 F (36.6 C)  TempSrc: Oral  SpO2: 99%  Weight: 153 lb 14.4 oz (69.8 kg)  Height: 5' (1.524 m)    Body mass index is 30.06 kg/m.  Physical Exam Vitals reviewed.  Constitutional:      General: She is awake.     Appearance: Normal appearance. She is well-developed and well-groomed. She is obese.  HENT:     Head: Normocephalic  and atraumatic.  Musculoskeletal:     Lumbar back: No swelling, edema, deformity, signs of trauma, tenderness or bony tenderness. Decreased range of motion. Positive right straight leg raise test. Negative left straight leg raise test.     Right hip: Normal. No deformity or tenderness. Normal range of motion. Normal strength.     Left hip: Normal. No tenderness. Normal range of motion. Normal strength.     Right upper leg: No swelling, edema, deformity or tenderness.     Left upper leg: No swelling, edema, deformity or tenderness.       Legs:  Neurological:     Mental Status: She is alert.  Psychiatric:        Attention and Perception: Attention normal.        Mood and Affect: Mood and affect normal.        Speech: Speech normal.        Behavior: Behavior normal. Behavior is cooperative.      No results found for this or any previous visit (from the past 2160 hour(s)).   PHQ2/9:    10/12/2021   11:14 AM 08/02/2021    3:47 PM 04/03/2021   10:28 AM 04/10/2020    1:20 PM 12/17/2019    2:37 PM  Depression screen PHQ 2/9  Decreased Interest 0 0 0 0 0  Down, Depressed, Hopeless 0 0 0 0 0  PHQ - 2 Score 0 0 0 0 0  Altered sleeping 0 0 0    Tired, decreased energy 0 0 0    Change in appetite 0 0 0    Feeling bad or failure about yourself  0 0 0    Trouble concentrating 0 0 0    Moving slowly or fidgety/restless 0 0 0    Suicidal thoughts 0 0 0    PHQ-9 Score 0 0 0    Difficult doing work/chores Not difficult at all Not difficult at all Not difficult at all        Fall Risk:    10/12/2021   11:13 AM 08/02/2021    3:45 PM 04/03/2021   10:28 AM 04/10/2020    1:20 PM 12/17/2019    2:37 PM  Fall Risk   Falls in the past year? 0 0 0 0 0  Number falls in past yr: 0 0 0 0 0  Injury with Fall? 0 0 0 0 0  Risk for fall due to : No Fall Risks      Follow up Falls prevention discussed;Education provided   Falls evaluation completed Falls evaluation completed      Functional  Status Survey: Is the patient deaf or have difficulty hearing?: No Does the patient have difficulty seeing, even when wearing glasses/contacts?: No Does the patient have difficulty concentrating, remembering, or making decisions?: No Does the patient have difficulty walking or climbing  stairs?: No Does the patient have difficulty dressing or bathing?: No Does the patient have difficulty doing errands alone such as visiting a doctor's office or shopping?: No    Assessment & Plan  Problem List Items Addressed This Visit   None Visit Diagnoses     Lumbar back pain    -  Primary Acute, ongoing  Appears to be on right side, unsure if related to right thigh pain No evidence of swelling, trauma, bony abnormality on exam Suspect muscular etiology She has tried stretches, Naproxen and Zanaflex without significant relief Recommend PT at this time for assistance,  Will provide meloxicam for pain reduction, can also use Tylenol for breakthrough Recommend she continue gentle stretches, warm compresses, massage and reduced lifting until advised otherwise by [place] or PT provider Follow up as needed.     Relevant Medications   meloxicam (MOBIC) 15 MG tablet   Other Relevant Orders   Ambulatory referral to Physical Therapy   Musculoskeletal thigh pain, right     Acute, ongoing Was previously seen in April for this - suspected hamstring etiology at that time, was provided with Naproxen and stretches for relief along with Zanaflex This has provided intermittent, moderate relief Will recommend PT today along with Meloxicam PO QD, can take Tylenol PRN  Can continue with stretches, massage, warm compresses as needed Follow up as needed.    Relevant Medications   meloxicam (MOBIC) 15 MG tablet   Other Relevant Orders   Ambulatory referral to Physical Therapy        No follow-ups on file.   I, Aidyn Kellis E Saadia Dewitt, PA-C, have reviewed all documentation for this visit. The documentation on 10/12/21 for  the exam, diagnosis, procedures, and orders are all accurate and complete.   Jacquelin Hawking, MHS, PA-C [facility]

## 2021-10-12 NOTE — Patient Instructions (Addendum)
Based on your symptoms and physical exam I believe the following is the cause of your concern today Back pain likely secondary to a strain of your back muscles  I recommend the following at this time to help relieve that discomfort:  Rest Warm compresses to the area (20 minutes on, minimum of 30 minutes off) You can alternate Tylenol and Ibuprofen for pain management but Ibuprofen is typically preferred to reduce inflammation.  I have sent in a script for meloxicam to be taken once per day for pain. This will replace NSAIDs. Stay well hydrated while taking this.  Gentle stretches and exercises that I have included in your paperwork Gentle massage to the areas are also sometimes beneficial Try to remain active as allowable to reduce stiffness and thus increase pain Try to reduce excess strain to the area and rest as much as possible  Wear supportive shoes and, if you must lift anything, use proper lifting techniques that spare your back.   I have placed a referral to Physical therapy. The referral should call you soon to set up the apt.   If these measures do not lead to improvement in your symptoms over the next 2-4 weeks please let us know

## 2021-10-14 ENCOUNTER — Other Ambulatory Visit: Payer: Self-pay | Admitting: Internal Medicine

## 2021-10-14 DIAGNOSIS — M545 Low back pain, unspecified: Secondary | ICD-10-CM

## 2021-10-15 NOTE — Telephone Encounter (Signed)
Requested medications are due for refill today.  no  Requested medications are on the active medications list.  no  Last refill. 10/10/2021   Future visit scheduled.   no  Notes to clinic.  Medication was d/c'd 10/12/2021. Refills not delegated.    Requested Prescriptions  Pending Prescriptions Disp Refills   tiZANidine (ZANAFLEX) 4 MG tablet [Pharmacy Med Name: TIZANIDINE 4MG  TABLETS] 30 tablet 0    Sig: TAKE 1 TABLET(4 MG) BY MOUTH EVERY 6 HOURS AS NEEDED FOR MUSCLE SPASMS     Not Delegated - Cardiovascular:  Alpha-2 Agonists - tizanidine Failed - 10/14/2021  3:35 AM      Failed - This refill cannot be delegated      Passed - Valid encounter within last 6 months    Recent Outpatient Visits           3 days ago Lumbar back pain   [facility] Mecum, [facility], PA-C   2 months ago Hamstring strain, right, initial encounter   [facility] [place], [place], DO   6 months ago Rhinosinusitis   [facility], DO   1 year ago Rhinosinusitis   [facility], PA-C   1 year ago Herpes zoster without complication   [facility], MD

## 2022-03-26 ENCOUNTER — Other Ambulatory Visit: Payer: Self-pay | Admitting: Internal Medicine

## 2022-03-26 DIAGNOSIS — J329 Chronic sinusitis, unspecified: Secondary | ICD-10-CM

## 2022-03-26 NOTE — Telephone Encounter (Signed)
Requested Prescriptions  Pending Prescriptions Disp Refills   montelukast (SINGULAIR) 10 MG tablet [Pharmacy Med Name: MONTELUKAST 10MG  TABLETS] 90 tablet 2    Sig: TAKE 1 TABLET(10 MG) BY MOUTH AT BEDTIME     Pulmonology:  Leukotriene Inhibitors Passed - 03/26/2022  3:35 AM      Passed - Valid encounter within last 12 months    Recent Outpatient Visits           5 months ago Lumbar back pain   [facility] Mecum, [facility], PA-C   7 months ago Hamstring strain, right, initial encounter   [facility], Margarita Mail   11 months ago Rhinosinusitis   [facility], DO   1 year ago Rhinosinusitis   [facility], PA-C   2 years ago Herpes zoster without complication   [facility], MD

## 2022-05-23 ENCOUNTER — Other Ambulatory Visit: Payer: Self-pay | Admitting: Internal Medicine

## 2022-05-23 DIAGNOSIS — J329 Chronic sinusitis, unspecified: Secondary | ICD-10-CM

## 2022-05-24 ENCOUNTER — Other Ambulatory Visit: Payer: Self-pay | Admitting: Certified Nurse Midwife

## 2022-05-24 NOTE — Telephone Encounter (Signed)
Requested Prescriptions  Pending Prescriptions Disp Refills   cetirizine (ZYRTEC) 10 MG tablet [Pharmacy Med Name: CETIRIZINE 10MG TABLETS] 90 tablet 0    Sig: TAKE 1 TABLET(10 MG) BY MOUTH DAILY     Ear, Nose, and Throat:  Antihistamines 2 Failed - 05/23/2022  9:08 PM      Failed - Cr in normal range and within 360 days    Creat  Date Value Ref Range Status  02/13/2018 0.76 0.50 - 1.10 mg/dL Final         Passed - Valid encounter within last 12 months    Recent Outpatient Visits           7 months ago Lumbar back pain   [place], PA-C   9 months ago Hamstring strain, right, initial encounter   [facility] Teodora Medici, DO   1 year ago [facility] Teodora Medici, DO   2 years ago [facility] Delsa Grana, PA-C   2 years ago Herpes zoster without complication   [facility] Towanda Malkin, MD

## 2022-06-19 ENCOUNTER — Other Ambulatory Visit: Payer: Self-pay | Admitting: Certified Nurse Midwife

## 2022-06-20 ENCOUNTER — Encounter: Payer: Self-pay | Admitting: Obstetrics and Gynecology

## 2022-06-20 ENCOUNTER — Ambulatory Visit (INDEPENDENT_AMBULATORY_CARE_PROVIDER_SITE_OTHER): Payer: BC Managed Care – PPO | Admitting: Obstetrics and Gynecology

## 2022-06-20 VITALS — BP 110/60 | Ht 60.0 in | Wt 157.0 lb

## 2022-06-20 DIAGNOSIS — Z01419 Encounter for gynecological examination (general) (routine) without abnormal findings: Secondary | ICD-10-CM

## 2022-06-20 DIAGNOSIS — Z3041 Encounter for surveillance of contraceptive pills: Secondary | ICD-10-CM

## 2022-06-20 MED ORDER — LO LOESTRIN FE 1 MG-10 MCG / 10 MCG PO TABS: 1.0000 | ORAL_TABLET | Freq: Every day | ORAL | 3 refills | Status: DC

## 2022-06-20 NOTE — Patient Instructions (Signed)
I value your feedback and you entrusting us with your care. If you get a [facility] patient survey, I would appreciate you taking the time to let us know about your experience today. Thank you! ? ? ?

## 2022-06-20 NOTE — Progress Notes (Signed)
PCP:  Teodora Medici, DO   Chief Complaint  Patient presents with   Gynecologic Exam    No concerns     HPI:      Ms. Autumn Todd is a 37 y.o. [id number] whose LMP was Patient's last menstrual period was 04/29/2022 (approximate)., presents today for her annual examination.  Her menses are every 1-2 months on OCPs, lasting 2-3 days.  Dysmenorrhea for 1 day, improved with motrin. She does not have intermenstrual bleeding.  Sex activity: single partner, contraception - OCP (estrogen/progesterone). No pain/bleeding Last Pap: 04/04/20 Results were: no abnormalities /neg HPV DNA. No hx of abn paps.  There is no FH of breast cancer. There is no FH of ovarian cancer. The patient does do self-breast exams.  Tobacco use: The patient denies current or previous tobacco use. Alcohol use: none No drug use.  Exercise: moderately active  She does get adequate calcium and Vitamin D in her diet.  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   Asthma, mild intermittent 10/11/2014    Past Surgical History:  Procedure Laterality Date   CESAREAN SECTION N/A 04/27/2015   Procedure: CESAREAN SECTION;  Surgeon: Aletha Halim, MD;  Location: [facility] ORS;  Service: Obstetrics;  Laterality: N/A;   CESAREAN SECTION N/A 06/06/2019   Procedure: CESAREAN SECTION;  Surgeon: Harlin Heys, MD;  Location: [facility] ORS;  Service: Obstetrics;  Laterality: N/A;   WISDOM TOOTH EXTRACTION      Family History  Problem Relation Age of Onset   Cancer Maternal Grandfather        unsure of type    Social History   Socioeconomic History   Marital status: Married    Spouse name: Not on file   Number of children: 0   Years of education: Not on file   Highest education  level: Not on file  Occupational History   Occupation: Teacher    Comment: [employer]  Tobacco Use   Smoking status: Never   Smokeless tobacco: Never  Vaping Use   Vaping Use: Never used  Substance and Sexual Activity   Alcohol use: No    Alcohol/week: 0.0 standard drinks of alcohol   Drug use: No   Sexual activity: Yes    Partners: Male    Birth control/protection: Pill  Other Topics Concern   Not on file  Social History Narrative   Not on file   Social Determinants of Health   Financial Resource Strain: Not on file  Food Insecurity: Not on file  Transportation Needs: Not on file  Physical Activity: Not on file  Stress: Not on file  Social Connections: Not on file  Intimate Partner Violence: Not on file     Current Outpatient Medications:    cetirizine (ZYRTEC) 10 MG tablet, TAKE 1 TABLET(10 MG) BY MOUTH DAILY, Disp: 90 tablet, Rfl: 0   montelukast (SINGULAIR) 10 MG tablet, TAKE 1 TABLET(10 MG) BY MOUTH AT BEDTIME, Disp: 90 tablet, Rfl: 2   Norethindrone-Ethinyl Estradiol-Fe Biphas (LO LOESTRIN FE) 1 MG-10 MCG / 10 MCG tablet, Take 1 tablet by mouth daily., Disp: 84 tablet, Rfl: 3     ROS:  Review of Systems  Constitutional:  Negative for fatigue, fever and unexpected weight change.  Respiratory:  Negative for cough, shortness of breath and wheezing.  Cardiovascular:  Negative for chest pain, palpitations and leg swelling.  Gastrointestinal:  Negative for blood in stool, constipation, diarrhea, nausea and vomiting.  Endocrine: Negative for cold intolerance, heat intolerance and polyuria.  Genitourinary:  Negative for dyspareunia, dysuria, flank pain, frequency, genital sores, hematuria, menstrual problem, pelvic pain, urgency, vaginal bleeding, vaginal discharge and vaginal pain.  Musculoskeletal:  Negative for back pain, joint swelling and myalgias.  Skin:  Negative for rash.  Neurological:  Negative for dizziness, syncope, light-headedness, numbness and headaches.   Hematological:  Negative for adenopathy.  Psychiatric/Behavioral:  Negative for agitation, confusion, sleep disturbance and suicidal ideas. The patient is not nervous/anxious.    BREAST: No symptoms   Objective: BP 110/60   Ht 5' (1.524 m)   Wt 157 lb (71.2 kg)   LMP 04/29/2022 (Approximate)   BMI 30.66 kg/m    Physical Exam Constitutional:      Appearance: She is well-developed.  Genitourinary:     Vulva normal.     Right Labia: No rash, tenderness or lesions.    Left Labia: No tenderness, lesions or rash.    No vaginal discharge, erythema or tenderness.      Right Adnexa: not tender and no mass present.    Left Adnexa: not tender and no mass present.    No cervical friability or polyp.     Uterus is not enlarged or tender.  Breasts:    Right: No mass, nipple discharge, skin change or tenderness.     Left: No mass, nipple discharge, skin change or tenderness.  Neck:     Thyroid: No thyromegaly.  Cardiovascular:     Rate and Rhythm: Normal rate and regular rhythm.     Heart sounds: Normal heart sounds. No murmur heard. Pulmonary:     Effort: Pulmonary effort is normal.     Breath sounds: Normal breath sounds.  Abdominal:     Palpations: Abdomen is soft.     Tenderness: There is no abdominal tenderness. There is no guarding or rebound.  Musculoskeletal:        General: Normal range of motion.     Cervical back: Normal range of motion.  Lymphadenopathy:     Cervical: No cervical adenopathy.  Neurological:     General: No focal deficit present.     Mental Status: She is alert and oriented to person, place, and time.     Cranial Nerves: No cranial nerve deficit.  Skin:    General: Skin is warm and dry.  Psychiatric:        Mood and Affect: Mood normal.        Behavior: Behavior normal.        Thought Content: Thought content normal.        Judgment: Judgment normal.  Vitals reviewed.     Assessment/Plan: Encounter for annual routine gynecological  examination  Encounter for surveillance of contraceptive pills - Plan: Norethindrone-Ethinyl Estradiol-Fe Biphas (LO LOESTRIN FE) 1 MG-10 MCG / 10 MCG tablet; OCP RF  Meds ordered this encounter  Medications   Norethindrone-Ethinyl Estradiol-Fe Biphas (LO LOESTRIN FE) 1 MG-10 MCG / 10 MCG tablet    Sig: Take 1 tablet by mouth daily.    Dispense:  84 tablet    Refill:  3    Order Specific Question:   Supervising Provider    Answer:   Renaldo Reel             GYN counsel adequate intake of calcium and vitamin D, diet and  exercise     F/U  Return in about 1 year (around 06/20/2023).  Rahiem Schellinger B. Addyson Traub, PA-C 06/20/2022 4:20 PM

## 2022-08-19 ENCOUNTER — Other Ambulatory Visit: Payer: Self-pay | Admitting: Internal Medicine

## 2022-08-19 DIAGNOSIS — J329 Chronic sinusitis, unspecified: Secondary | ICD-10-CM

## 2022-08-20 NOTE — Telephone Encounter (Signed)
Requested medication (s) are due for refill today: Yes  Requested medication (s) are on the active medication list: yes    Last refill: 05/24/22  #90  0 refills  Future visit scheduled no  Notes to clinic:Failed due to labs, please review. Thank you.  Requested Prescriptions  Pending Prescriptions Disp Refills   cetirizine (ZYRTEC) 10 MG tablet [Pharmacy Med Name: CETIRIZINE 10MG  TABLETS] 90 tablet 0    Sig: TAKE 1 TABLET(10 MG) BY MOUTH DAILY     Ear, Nose, and Throat:  Antihistamines 2 Failed - 08/19/2022  9:06 PM      Failed - Cr in normal range and within 360 days    Creat  Date Value Ref Range Status  02/13/2018 0.76 0.50 - 1.10 mg/dL Final         Passed - Valid encounter within last 12 months    Recent Outpatient Visits           10 months ago Lumbar back pain   [facility] Mecum, Oswaldo Conroy, PA-C   1 year ago Hamstring strain, right, initial encounter   [facility] Margarita Mail, DO   1 year ago Rhinosinusitis   [facility] Margarita Mail, DO   2 years ago Rhinosinusitis   [facility] Danelle Berry, PA-C   2 years ago Herpes zoster without complication   [facility] Jamelle Haring, MD

## 2022-11-29 ENCOUNTER — Ambulatory Visit: Payer: BC Managed Care – PPO

## 2022-11-29 ENCOUNTER — Other Ambulatory Visit (HOSPITAL_COMMUNITY)
Admission: RE | Admit: 2022-11-29 | Discharge: 2022-11-29 | Disposition: A | Discharge status: Home / Self Care | Payer: BC Managed Care – PPO | Source: Ambulatory Visit | Attending: Obstetrics and Gynecology | Admitting: Obstetrics and Gynecology

## 2022-11-29 VITALS — BP 122/81 | HR 121 | Ht 60.0 in | Wt 162.9 lb

## 2022-11-29 DIAGNOSIS — N760 Acute vaginitis: Secondary | ICD-10-CM | POA: Diagnosis not present

## 2022-11-29 DIAGNOSIS — N898 Other specified noninflammatory disorders of vagina: Secondary | ICD-10-CM

## 2022-11-29 NOTE — Progress Notes (Signed)
    NURSE VISIT NOTE  Subjective:    Patient ID: Autumn Todd, female    DOB: Jan 25, 1986, 37 y.o.   MRN: [id number]  HPI  Patient is a 37 y.o. G12P2012 female who presents for  vaginal itching for 2 week(s). Denies abnormal vaginal bleeding or significant pelvic pain or fever. denies UTI symptoms. Patient denies history of known exposure to STD.   Objective:    BP 122/81   Pulse (!) 121   Ht 5' (1.524 m)   Wt 162 lb 14.4 oz (73.9 kg)   LMP  (LMP Unknown) Comment: irregular  BMI 31.81 kg/m     Assessment:   1. Acute vaginitis   2. Vagina itching      Plan:   GC and chlamydia DNA  probe sent to lab. Treatment: await results for further treatment also advised Monistat 7 day if symptoms persist.  ROV prn if symptoms persist or worsen.   Loman Chroman, CMA

## 2022-12-03 LAB — CERVICOVAGINAL ANCILLARY ONLY
Bacterial Vaginitis (gardnerella): NEGATIVE
Candida Glabrata: NEGATIVE
Candida Vaginitis: NEGATIVE
Comment: NEGATIVE
Comment: NEGATIVE
Comment: NEGATIVE

## 2022-12-11 ENCOUNTER — Ambulatory Visit: Payer: BC Managed Care – PPO | Admitting: Family Medicine

## 2022-12-18 ENCOUNTER — Ambulatory Visit (INDEPENDENT_AMBULATORY_CARE_PROVIDER_SITE_OTHER): Payer: BC Managed Care – PPO | Admitting: Family Medicine

## 2022-12-18 VITALS — BP 132/78 | HR 103 | Temp 97.8°F | Resp 16 | Ht 60.0 in | Wt 161.0 lb

## 2022-12-18 DIAGNOSIS — Z9109 Other allergy status, other than to drugs and biological substances: Secondary | ICD-10-CM | POA: Diagnosis not present

## 2022-12-18 DIAGNOSIS — J329 Chronic sinusitis, unspecified: Secondary | ICD-10-CM | POA: Diagnosis not present

## 2022-12-18 DIAGNOSIS — R21 Rash and other nonspecific skin eruption: Secondary | ICD-10-CM | POA: Diagnosis not present

## 2022-12-18 DIAGNOSIS — J4520 Mild intermittent asthma, uncomplicated: Secondary | ICD-10-CM | POA: Diagnosis not present

## 2022-12-18 MED ORDER — TRIAMCINOLONE ACETONIDE 0.1 % EX OINT: 1.0000 | TOPICAL_OINTMENT | Freq: Two times a day (BID) | CUTANEOUS | 1 refills | Status: DC | PRN

## 2022-12-18 MED ORDER — CETIRIZINE HCL 10 MG PO TABS: 10.0000 mg | ORAL_TABLET | Freq: Every day | ORAL | 1 refills | Status: DC

## 2022-12-18 MED ORDER — MONTELUKAST SODIUM 10 MG PO TABS: 10.0000 mg | ORAL_TABLET | Freq: Every day | ORAL | 1 refills | Status: DC

## 2022-12-18 NOTE — Progress Notes (Signed)
Patient ID: Autumn Todd, female    DOB: [birth year], 37 y.o.   MRN: [id number]  PCP: Margarita Mail, DO  Chief Complaint  Patient presents with   itching    Bilateral hands onset for over a week, [facility] given Triamcinolone cream    Subjective:   Autumn Todd is a 37 y.o. female, presents to clinic with CC of the following:  HPI  Itchy hands with red rash, raw inbetween fingers onset about 2 weeks ago, [facility] and was treated with triamcinolone.  She had possible exposure to some weightlifting gloves around the time that the rash started no other known triggers or exposures.  She has history of asthma and environmental allergies.  No history of eczema She notes improvement in the itching with twice daily triamcinolone cream but the itching increased slightly in the last couple days and it has not gone completely away which she expected it to she is here for follow-up and also requests refills on her other medications Last OV with PCP was 07/2021  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   Asthma, mild intermittent 10/11/2014      Current Outpatient Medications:    Norethindrone-Ethinyl Estradiol-Fe Biphas (LO LOESTRIN FE) 1 MG-10 MCG / 10 MCG tablet, Take 1 tablet by mouth daily., Disp: 84 tablet, Rfl: 3   triamcinolone cream (KENALOG) 0.1 %, Apply topically daily., Disp: , Rfl:    triamcinolone ointment (KENALOG) 0.1 %, Apply 1 Application topically 2 (two) times daily as needed (rash/itching/eczema areas)., Disp: 30 g, Rfl: 1   cetirizine (ZYRTEC) 10 MG tablet, Take 1 tablet (10 mg total) by mouth at bedtime. TAKE 1 TABLET(10 MG) BY MOUTH DAILY, Disp: 90 tablet, Rfl: 1    montelukast (SINGULAIR) 10 MG tablet, Take 1 tablet (10 mg total) by mouth at bedtime., Disp: 90 tablet, Rfl: 1   No Known Allergies   Social History   Tobacco Use   Smoking status: Never   Smokeless tobacco: Never  Vaping Use   Vaping status: Never Used  Substance Use Topics   Alcohol use: No    Alcohol/week: 0.0 standard drinks of alcohol   Drug use: No      Chart Review Today: I personally reviewed active problem list, medication list, allergies, family history, social history, health maintenance, notes from last encounter, lab results, imaging with the patient/caregiver today.   Review of Systems  Constitutional: Negative.   HENT: Negative.    Eyes: Negative.   Respiratory: Negative.    Cardiovascular: Negative.   Gastrointestinal: Negative.   Endocrine: Negative.   Genitourinary: Negative.   Musculoskeletal: Negative.   Skin: Negative.   Allergic/Immunologic: Negative.   Neurological: Negative.   Hematological: Negative.   Psychiatric/Behavioral: Negative.    All other systems reviewed and are negative.      Objective:   Vitals:   12/18/22 1544  BP: 132/78  Pulse: (!) 103  Resp: 16  Temp: 97.8 F (36.6 C)  TempSrc: Oral  SpO2: 98%  Weight: 161 lb (73 kg)  Height: 5' (1.524 m)    Body mass index is 31.44 kg/m.  Physical Exam Vitals and nursing note reviewed.  Constitutional:      General: She is not in acute distress.  Appearance: Normal appearance. She is well-developed. She is not ill-appearing, toxic-appearing or diaphoretic.  HENT:     Head: Normocephalic and atraumatic.     Nose: Nose normal.  Eyes:     General:        Right eye: No discharge.        Left eye: No discharge.     Conjunctiva/sclera: Conjunctivae normal.  Neck:     Trachea: No tracheal deviation.  Cardiovascular:     Rate and Rhythm: Normal rate and regular rhythm.  Pulmonary:     Effort: Pulmonary effort is normal. No respiratory distress.     Breath sounds: No  stridor.  Musculoskeletal:        General: Normal range of motion.  Skin:    General: Skin is warm and dry.     Findings: Rash present.  Neurological:     Mental Status: She is alert.     Motor: No abnormal muscle tone.     Coordination: Coordination normal.  Psychiatric:        Behavior: Behavior normal.      Results for orders placed or performed in visit on 11/29/22  Cervicovaginal ancillary only  Result Value Ref Range   Bacterial Vaginitis (gardnerella) Negative    Candida Vaginitis Negative    Candida Glabrata Negative    Comment Normal Reference Range Candida Species - Negative    Comment Normal Reference Range Candida Galbrata - Negative    Comment      Normal Reference Range Bacterial Vaginosis - Negative       Assessment & Plan:     ICD-10-CM   1. Rash and nonspecific skin eruption  R21 triamcinolone cream (KENALOG) 0.1 %    triamcinolone ointment (KENALOG) 0.1 %   contact dermatitis vs dyshidrotic eczema, she responded to triamcinolone, would continue topical steroids, Vaseline and avoiding dryness and irritants    2. Rhinosinusitis  J32.9 cetirizine (ZYRTEC) 10 MG tablet    montelukast (SINGULAIR) 10 MG tablet   sx stable, she requests refills    3. Environmental allergies  Z91.09 cetirizine (ZYRTEC) 10 MG tablet   same as #2    4. Mild intermittent asthma without complication  J45.20 montelukast (SINGULAIR) 10 MG tablet   not having exacerbations or needing inhaler, meds seem to keep controlled       Return in about 6 months (around 06/17/2023) for routine med f/up and refills with PCP in the next 6 months, last was 07/2021.    Danelle Berry, PA-C 12/18/22 4:20 PM

## 2022-12-18 NOTE — Patient Instructions (Signed)
Recommend limiting exposure to soaps/water and alcohol based hand cleaning products, increase hydration especially with vaseline and after any hand washing or shows/bathing/dishes etc. Use topical steroids to affected areas 2x a day for up to 2-3 weeks and decrease use when starting to improve, and continue allergy/asthma medications as well.   Dyshidrotic Eczema Dyshidrotic eczema, also known as pompholyx, is a type of eczema that causes very itchy, fluid-filled blisters (vesicles) to form on the hands and feet. It is more common before age 3, though it can affect people of any age. There is no cure, but treatment and certain lifestyle changes can help relieve symptoms. What are the causes? The cause of this condition is not known. What increases the risk? You are more likely to develop this condition if: You wash your hands frequently. You have a personal or family history of eczema, allergies, asthma, or hay fever. You are allergic to metals, such as nickel or cobalt. You work with cement. You smoke. What are the signs or symptoms? Symptoms of this condition may affect the hands, the feet, or both. Symptoms may come and go (recur), and may include: Severe itching. This may happen before blisters appear. Blisters. These may form suddenly. In the early stages, blisters may form near the fingertips. In severe cases, blisters may grow to large blister masses (bullae). Blisters resolve in 2-3 weeks without bursting. This is followed by a dry phase in which itching eases. Pain and swelling. Cracks or long, narrow openings (fissures) in the skin. Severe dryness. Ridges on the nails. How is this diagnosed? This condition may be diagnosed based on: Your symptoms and a physical exam. Your medical history. Skin scrapings to rule out a fungal infection. Testing a swab of fluid for bacteria (culture). Removing a small piece of skin (biopsy) to test for infection or to rule out other  conditions. Skin patch tests. These tests involve using patches that contain possible allergens and placing them on your back. Your health care provider will wait a few days and then check to see if an allergic reaction occurred. These tests may be done if your health care provider suspects allergic reactions, or to rule out other types of eczema. You may be referred to a health care provider who specializes in skin conditions (dermatologist) to help diagnose and treat this condition. How is this treated? There is no cure for this condition, but treatment can help relieve symptoms. Depending on the amount and severity of the blisters, your health care provider may suggest: Avoiding allergens, irritants, or triggers that worsen symptoms. This may involve lifestyle changes, such as: Using different lotions or soaps. Avoiding hot weather or places that will cause you to sweat a lot. Managing stress with coping techniques, such as relaxation and exercise, and asking for help when you need it. Diet changes as recommended by your health care provider. Using a clean, damp towel (cool compress) to relieve symptoms. Soaking in a bath that contains a type of salt that relieves irritation (aluminum acetate soaks). Medicines, such as: Medicine taken by mouth to reduce itching (oral antihistamines). Medicine applied to the skin to reduce swelling and irritation (topical corticosteroids). Medicine that reduces the activity of the body's disease-fighting system (immunosuppressants) to treat inflammation. This may be given in severe cases. Antibiotic medicines to treat bacterial infection. Light therapy (phototherapy). This involves shining ultraviolet (UV) light on the affected skin in order to reduce itchiness and inflammation. Follow these instructions at home: Bathing and skin care  [facility]  skin gently. After bathing or washing your hands, pat your skin dry. Avoid rubbing your skin. Remove all jewelry before  bathing. If the skin under the jewelry stays wet, blisters may form or get worse. Apply cool compresses as told by your health care provider. To do this: Soak a clean towel in cool water. Wring out excess water until towel is damp. Place the towel over the affected skin. Leave the towel on for 20 minutes at a time, 2-3 times a day. Use mild soaps, cleansers, and lotions that do not contain dyes, perfumes, or other irritants. Keep your skin hydrated. To do this: Avoid very hot water. Take lukewarm baths or showers. Apply moisturizer within 3 minutes of bathing. This locks in moisture. Medicines Take and apply over-the-counter and prescription medicines only as told by your health care provider. If you were prescribed an antibiotic medicine, take or apply it as told by your health care provider. Do not stop using the antibiotic even if you start to feel better. General instructions Do not use any products that contain nicotine or tobacco. These include cigarettes, chewing tobacco, and vaping devices, such as e-cigarettes. If you need help quitting, ask your health care provider. Identify and avoid triggers and allergens. Keep fingernails short to avoid breaking the skin while scratching. Use waterproof gloves to protect your hands when doing work that keeps your hands wet for a long time. Wear socks to keep your feet dry. Keep all follow-up visits. This is important. Contact a health care provider if: You have symptoms that do not go away. You have signs of infection, such as: Crusting, pus, or a bad smell. More redness, swelling, or pain. Increased warmth in the affected area. Get help right away if: Your skin gets streaking redness with associated pain. Summary Dyshidrotic eczema, also known as pompholyx, is a type of eczema that causes very itchy, fluid-filled blisters (vesicles) to form on the hands and feet. The cause of this condition is not known. There is no cure for this  condition, but treatment can help relieve symptoms. Treatment depends on the amount and severity of the blisters. Use mild soaps, cleansers, and lotions that do not contain dyes, perfumes, or other irritants. Keep your skin hydrated. This information is not intended to replace advice given to you by your health care provider. Make sure you discuss any questions you have with your health care provider. Document Revised: 01/10/2020 Document Reviewed: 01/10/2020 Elsevier Patient Education  2024 [employer].

## 2023-01-08 ENCOUNTER — Encounter: Payer: Self-pay | Admitting: Family Medicine

## 2023-04-29 ENCOUNTER — Ambulatory Visit: Payer: Self-pay

## 2023-04-29 NOTE — Telephone Encounter (Signed)
 Summary: diarrhea   Pt called in has bouts of diarrhea, last time was yesterday        Chief Complaint: Diarrhea that comes and goes. Mild abdominal pain. Symptoms: Above Frequency: 3 months Pertinent Negatives: Patient denies  Disposition: [] ED /[] Urgent Care (no appt availability in office) / [x] Appointment(In office/virtual)/ []  [facility]/ [] Home Care/ [] Refused Recommended Disposition /[] [facility] [facility]/ []  Follow-up with PCP Additional Notes: Agrees with appointment.  Reason for Disposition  [1] Mild diarrhea (e.g., 1-3 or more stools than normal in past 24 hours) without known cause AND [2] present >  7 days  Answer Assessment - Initial Assessment Questions 1. DIARRHEA SEVERITY: How bad is the diarrhea? How many more stools have you had in the past 24 hours than normal?    - NO DIARRHEA (SCALE 0)   - MILD (SCALE 1-3): Few loose or mushy BMs; increase of 1-3 stools over normal daily number of stools; mild increase in ostomy output.   -  MODERATE (SCALE 4-7): Increase of 4-6 stools daily over normal; moderate increase in ostomy output.   -  SEVERE (SCALE 8-10; OR WORST POSSIBLE): Increase of 7 or more stools daily over normal; moderate increase in ostomy output; incontinence.     Mild 2. ONSET: When did the diarrhea begin?      3 months ago 3. BM CONSISTENCY: How loose or watery is the diarrhea?      Both 4. VOMITING: Are you also vomiting? If Yes, ask: How many times in the past 24 hours?      No 5. ABDOMEN PAIN: Are you having any abdomen pain? If Yes, ask: What does it feel like? (e.g., crampy, dull, intermittent, constant)      Mild 6. ABDOMEN PAIN SEVERITY: If present, ask: How bad is the pain?  (e.g., Scale 1-10; mild, moderate, or severe)   - MILD (1-3): doesn't interfere with normal activities, abdomen soft and not tender to touch    - MODERATE (4-7): interferes with normal activities or awakens from sleep, abdomen tender to touch     - SEVERE (8-10): excruciating pain, doubled over, unable to do any normal activities       Mild 7. ORAL INTAKE: If vomiting, Have you been able to drink liquids? How much liquids have you had in the past 24 hours?     Yes 8. HYDRATION: Any signs of dehydration? (e.g., dry mouth [not just dry lips], too weak to stand, dizziness, new weight loss) When did you last urinate?     No 9. EXPOSURE: Have you traveled to a foreign country recently? Have you been exposed to anyone with diarrhea? Could you have eaten any food that was spoiled?     No 10. ANTIBIOTIC USE: Are you taking antibiotics now or have you taken antibiotics in the past 2 months?       No 11. OTHER SYMPTOMS: Do you have any other symptoms? (e.g., fever, blood in stool)       No 12. PREGNANCY: Is there any chance you are pregnant? When was your last menstrual period?       No  Protocols used: Diarrhea-A-AH

## 2023-05-06 ENCOUNTER — Other Ambulatory Visit: Payer: Self-pay

## 2023-05-06 ENCOUNTER — Encounter: Payer: Self-pay | Admitting: Internal Medicine

## 2023-05-06 ENCOUNTER — Telehealth (INDEPENDENT_AMBULATORY_CARE_PROVIDER_SITE_OTHER): Payer: 59 | Admitting: Internal Medicine

## 2023-05-06 DIAGNOSIS — K580 Irritable bowel syndrome with diarrhea: Secondary | ICD-10-CM

## 2023-05-06 NOTE — Progress Notes (Signed)
Virtual Visit via Video Note  I connected with Autumn Todd on 05/06/23 at  3:00 PM EST by a video enabled telemedicine application and verified that I am speaking with the correct person using two identifiers.  Location: Patient: Home Provider: [facility]   I discussed the limitations of evaluation and management by telemedicine and the availability of in person appointments. The patient expressed understanding and agreed to proceed.  History of Present Illness:  Patient is presenting via telemedicine to discuss diarrhea, which is occurring off and on for several months. Keeping track of food, nothing in particular triggering symptoms. Some weeks will have symptoms about 3 days a week. Sometimes will have abdominal pain, cramping. Mild cramping. Watery stools, soft. No mucus or blood. No constipation. No appetite change or weight loss. Denies recent antibiotic use or travel.   Observations/Objective:  General: well appearing, no acute distress ENT: conjunctiva normal appearing bilaterally  Skin: no rashes, cyanosis or abnormal bruising noted Neuro: answers all questions appropriately   Assessment and Plan:  1. Irritable bowel syndrome with diarrhea (Primary): Symptoms consistent with IBS diarrhea. Discussed low FOD-MAP diet, starting a probiotic and fiber supplements. If symptoms do not improve by follow up, will obtain labs to rule out thyroid disease, food allergies and stool culture.    Follow Up Instructions: 6 months or sooner as needed    I discussed the assessment and treatment plan with the patient. The patient was provided an opportunity to ask questions and all were answered. The patient agreed with the plan and demonstrated an understanding of the instructions.   The patient was advised to call back or seek an in-person evaluation if the symptoms worsen or if the condition fails to improve as anticipated.  I provided 15 minutes of non-face-to-face time during this  encounter.   Margarita Mail, DO

## 2023-05-08 ENCOUNTER — Encounter: Payer: Self-pay | Admitting: Internal Medicine

## 2023-05-23 ENCOUNTER — Other Ambulatory Visit: Payer: Self-pay | Admitting: Obstetrics and Gynecology

## 2023-05-23 DIAGNOSIS — Z3041 Encounter for surveillance of contraceptive pills: Secondary | ICD-10-CM

## 2023-05-24 ENCOUNTER — Encounter: Payer: Self-pay | Admitting: Obstetrics and Gynecology

## 2023-05-26 ENCOUNTER — Other Ambulatory Visit: Payer: Self-pay

## 2023-05-26 DIAGNOSIS — Z3041 Encounter for surveillance of contraceptive pills: Secondary | ICD-10-CM

## 2023-05-26 MED ORDER — LO LOESTRIN FE 1 MG-10 MCG / 10 MCG PO TABS: 1.0000 | ORAL_TABLET | Freq: Every day | ORAL | 0 refills | Status: DC

## 2023-06-20 ENCOUNTER — Other Ambulatory Visit: Payer: Self-pay | Admitting: Obstetrics and Gynecology

## 2023-06-20 DIAGNOSIS — Z3041 Encounter for surveillance of contraceptive pills: Secondary | ICD-10-CM

## 2023-06-30 ENCOUNTER — Other Ambulatory Visit: Payer: Self-pay | Admitting: Emergency Medicine

## 2023-06-30 ENCOUNTER — Telehealth: Payer: Self-pay | Admitting: Internal Medicine

## 2023-06-30 DIAGNOSIS — J329 Chronic sinusitis, unspecified: Secondary | ICD-10-CM

## 2023-06-30 DIAGNOSIS — J4520 Mild intermittent asthma, uncomplicated: Secondary | ICD-10-CM

## 2023-06-30 MED ORDER — MONTELUKAST SODIUM 10 MG PO TABS: 10.0000 mg | ORAL_TABLET | Freq: Every day | ORAL | 1 refills | Status: DC

## 2023-06-30 NOTE — Telephone Encounter (Signed)
 Order pend for refills to provider

## 2023-06-30 NOTE — Telephone Encounter (Signed)
 Refill from [facility]  montelukast (SINGULAIR) 10 MG tablet

## 2023-07-11 ENCOUNTER — Ambulatory Visit: Payer: Self-pay | Admitting: Licensed Practical Nurse

## 2023-07-22 ENCOUNTER — Other Ambulatory Visit: Payer: Self-pay | Admitting: Obstetrics and Gynecology

## 2023-07-22 DIAGNOSIS — Z3041 Encounter for surveillance of contraceptive pills: Secondary | ICD-10-CM

## 2023-07-23 ENCOUNTER — Ambulatory Visit: Payer: Self-pay | Admitting: Obstetrics

## 2023-07-23 ENCOUNTER — Telehealth: Payer: Self-pay

## 2023-07-23 DIAGNOSIS — Z0000 Encounter for general adult medical examination without abnormal findings: Secondary | ICD-10-CM

## 2023-07-23 DIAGNOSIS — Z3041 Encounter for surveillance of contraceptive pills: Secondary | ICD-10-CM

## 2023-07-23 MED ORDER — LO LOESTRIN FE 1 MG-10 MCG / 10 MCG PO TABS: 1.0000 | ORAL_TABLET | Freq: Every day | ORAL | 0 refills | Status: DC

## 2023-07-23 NOTE — Telephone Encounter (Signed)
 Pt left msg on triage. Had to r/s annual and needs [facility] RF to get her to her appt. Rx RF sent, pt aware.

## 2023-08-13 ENCOUNTER — Encounter: Payer: Self-pay | Admitting: Obstetrics

## 2023-08-13 ENCOUNTER — Ambulatory Visit (INDEPENDENT_AMBULATORY_CARE_PROVIDER_SITE_OTHER): Admitting: Obstetrics

## 2023-08-13 VITALS — BP 129/79 | HR 115 | Ht 60.0 in | Wt 159.0 lb

## 2023-08-13 DIAGNOSIS — Z01419 Encounter for gynecological examination (general) (routine) without abnormal findings: Secondary | ICD-10-CM | POA: Diagnosis not present

## 2023-08-13 DIAGNOSIS — Z3041 Encounter for surveillance of contraceptive pills: Secondary | ICD-10-CM

## 2023-08-13 DIAGNOSIS — Z9109 Other allergy status, other than to drugs and biological substances: Secondary | ICD-10-CM

## 2023-08-13 DIAGNOSIS — Z124 Encounter for screening for malignant neoplasm of cervix: Secondary | ICD-10-CM

## 2023-08-13 DIAGNOSIS — Z0000 Encounter for general adult medical examination without abnormal findings: Secondary | ICD-10-CM

## 2023-08-13 MED ORDER — LO LOESTRIN FE 1 MG-10 MCG / 10 MCG PO TABS: 1.0000 | ORAL_TABLET | Freq: Every day | ORAL | 3 refills | Status: AC

## 2023-08-13 NOTE — Progress Notes (Signed)
 ANNUAL GYNECOLOGICAL EXAM  SUBJECTIVE  HPI  Autumn  Todd is a 38 y.o.-year-old [id number] who presents for an annual gynecological exam today.  She denies pelvic pain, dyspareunia, abnormal vaginal bleeding or discharge, and UTI symptoms. She has no health concerns today. She is currently sexually active with one female partner. She uses Lo Loestrin  for birth control and is happy with this method.  Medical/Surgical History Past Medical History:  Diagnosis Date   Allergy    Anemia 07/24/2015   Anxiety 03/11/2018   Anxiety 03/11/2018   Asthma    not in four years   De Quervain's tenosynovitis, left 06/06/2015   Eczema of right hand 06/06/2015   Gastroesophageal reflux disease 04/10/2020   Sinus tachycardia    Past Surgical History:  Procedure Laterality Date   CESAREAN SECTION N/A 04/27/2015   Procedure: CESAREAN SECTION;  Surgeon: Raynell Caller, MD;  Location: [facility] ORS;  Service: Obstetrics;  Laterality: N/A;   CESAREAN SECTION N/A 06/06/2019   Procedure: CESAREAN SECTION;  Surgeon: Zenobia Hila, MD;  Location: [facility] ORS;  Service: Obstetrics;  Laterality: N/A;   WISDOM TOOTH EXTRACTION      Social History Lives with husband and 2 children. Feels safe there Work: [employer] officer Exercise: gym 3-4x/week Substances: Denies EtOH, tobacco, vape, and recreational drugs  Obstetric History OB History     Gravida  3   Para  2   Term  2   Preterm      AB  1   Living  2      SAB  1   IAB      Ectopic      Multiple  0   Live Births  2            GYN/Menstrual History No LMP recorded. (Menstrual status: Oral contraceptives). Periods approximately every other month Last Pap: 03/2020. NILM, negative Contraception: COCs  Prevention Endorses regular dental and eye exams Mammogram: at 42 Colonoscopy: at 47  Current Medications Outpatient Medications Prior to Visit  Medication Sig   cetirizine  (ZYRTEC ) 10 MG tablet Take 1 tablet (10 mg total) by  mouth at bedtime. TAKE 1 TABLET(10 MG) BY MOUTH DAILY   montelukast  (SINGULAIR ) 10 MG tablet Take 1 tablet (10 mg total) by mouth at bedtime.   Norethindrone -Ethinyl Estradiol -Fe Biphas (LO LOESTRIN FE ) 1 MG-10 MCG / 10 MCG tablet Take 1 tablet by mouth daily.   triamcinolone  cream (KENALOG ) 0.1 % Apply topically daily.   triamcinolone  ointment (KENALOG ) 0.1 % Apply 1 Application topically 2 (two) times daily as needed (rash/itching/eczema areas).   No facility-administered medications prior to visit.      [facility] - 08/13/23 1331       Pregnancy Intention Screening   Does the patient want to become pregnant in the next year? No    Does the patient's partner want to become pregnant in the next year? No    Would the patient like to discuss contraceptive options today? No      Contraception Wrap Up   Current Method Oral Contraceptive    End Method Oral Contraceptive    Contraception Counseling Provided No    How was the end contraceptive method provided? N/A              ROS Constitutional: Denied constitutional symptoms, night sweats, recent illness, fatigue, fever, insomnia and weight loss.  Eyes: Denied eye symptoms, eye pain, photophobia, vision change and visual disturbance.  Ears/Nose/Throat/Neck: Denied ear, nose, throat or neck symptoms,  hearing loss, nasal discharge, sinus congestion and sore throat.  Cardiovascular: Denied cardiovascular symptoms, arrhythmia, chest pain/pressure, edema, exercise intolerance, orthopnea and palpitations.  Respiratory: Denied pulmonary symptoms, asthma, pleuritic pain, productive sputum, cough, dyspnea and wheezing.  Gastrointestinal: Denied gastro-esophageal reflux, melena, nausea and vomiting.  Genitourinary: Denied genitourinary symptoms including symptomatic vaginal discharge, pelvic relaxation issues, and urinary complaints.  Musculoskeletal: Denied musculoskeletal symptoms, stiffness, swelling, muscle weakness and myalgia.   Dermatologic: Denied dermatology symptoms, rash and scar.  Neurologic: Denied neurology symptoms, dizziness, headache, neck pain and syncope.  Psychiatric: Denied psychiatric symptoms, anxiety and depression.  Endocrine: Denied endocrine symptoms including hot flashes and night sweats.    OBJECTIVE  Ht 5' (1.524 m)   Wt 159 lb (72.1 kg)   BMI 31.05 kg/m    Physical examination General NAD, Conversant  HEENT Atraumatic; Op clear with mmm.  Normo-cephalic. Pupils reactive. Anicteric sclerae  Thyroid /Neck Smooth without nodularity or enlargement. Normal ROM.  Neck Supple.  Skin No rashes, lesions or ulceration. Normal palpated skin turgor. No nodularity.  Breasts: Declined.  Lungs: Clear to auscultation.No rales or wheezes. Normal Respiratory effort, no retractions.  Heart: NSR.  No murmurs or rubs appreciated. No peripheral edema  Abdomen: Soft.  Non-tender.  No masses.  No HSM. No hernia  Extremities: Moves all appropriately.  Normal ROM for age. No lymphadenopathy.  Neuro: Oriented to PPT.  Normal mood. Normal affect.     Pelvic: Declined    ASSESSMENT  1) Annual exam 2) Birth control refill  PLAN 1) Physical exam as noted. Discussed healthy lifestyle choices and preventive care. Declines STI testing and routine labs. Pap due 2026. 2) Refills sent to pharmacy on file.  Return in one year for annual exam or as needed for concerns.   Zymiere Trostle, CNM

## 2023-08-20 ENCOUNTER — Other Ambulatory Visit: Payer: Self-pay | Admitting: Obstetrics

## 2023-08-20 DIAGNOSIS — Z3041 Encounter for surveillance of contraceptive pills: Secondary | ICD-10-CM

## 2023-08-21 ENCOUNTER — Other Ambulatory Visit: Payer: Self-pay | Admitting: Internal Medicine

## 2023-08-21 DIAGNOSIS — J329 Chronic sinusitis, unspecified: Secondary | ICD-10-CM

## 2023-08-21 DIAGNOSIS — Z9109 Other allergy status, other than to drugs and biological substances: Secondary | ICD-10-CM

## 2023-08-22 NOTE — Telephone Encounter (Signed)
 Requested medications are due for refill today.  yes  Requested medications are on the active medications list.  yes  Last refill. 12/18/2022 #90 1 rf  Future visit scheduled.   yes  Notes to clinic.  Labs are expired.     Requested Prescriptions  Pending Prescriptions Disp Refills   cetirizine  (ZYRTEC ) 10 MG tablet [Pharmacy Med Name: CETIRIZINE  10MG  TABLETS] 90 tablet 1    Sig: TAKE 1 TABLET(10 MG) BY MOUTH DAILY     Ear, Nose, and Throat:  Antihistamines 2 Failed - 08/22/2023  2:43 PM      Failed - Cr in normal range and within 360 days    Creat  Date Value Ref Range Status  02/13/2018 0.76 0.50 - 1.10 mg/dL Final         Failed - Valid encounter within last 12 months    Recent Outpatient Visits   None     Future Appointments             In 2 months Rockney Cid, DO [place] [facility], [facility]

## 2023-10-29 ENCOUNTER — Other Ambulatory Visit: Payer: Self-pay

## 2023-10-29 ENCOUNTER — Ambulatory Visit: Admitting: Internal Medicine

## 2023-10-29 ENCOUNTER — Encounter: Payer: Self-pay | Admitting: Internal Medicine

## 2023-10-29 VITALS — BP 120/70 | HR 100 | Temp 97.9°F | Resp 18 | Ht 60.0 in | Wt 158.8 lb

## 2023-10-29 DIAGNOSIS — Z9109 Other allergy status, other than to drugs and biological substances: Secondary | ICD-10-CM

## 2023-10-29 DIAGNOSIS — Z0000 Encounter for general adult medical examination without abnormal findings: Secondary | ICD-10-CM

## 2023-10-29 DIAGNOSIS — Z13220 Encounter for screening for lipoid disorders: Secondary | ICD-10-CM

## 2023-10-29 DIAGNOSIS — E739 Lactose intolerance, unspecified: Secondary | ICD-10-CM | POA: Diagnosis not present

## 2023-10-29 LAB — COMPREHENSIVE METABOLIC PANEL WITH GFR
AG Ratio: 1.7 (calc) (ref 1.0–2.5)
ALT: 19 U/L (ref 6–29)
AST: 16 U/L (ref 10–30)
Albumin: 4.3 g/dL (ref 3.6–5.1)
Alkaline phosphatase (APISO): 55 U/L (ref 31–125)
BUN: 13 mg/dL (ref 7–25)
CO2: 24 mmol/L (ref 20–32)
Calcium: 9.6 mg/dL (ref 8.6–10.2)
Chloride: 105 mmol/L (ref 98–110)
Creat: 0.7 mg/dL (ref 0.50–0.97)
Globulin: 2.6 g/dL (ref 1.9–3.7)
Glucose, Bld: 90 mg/dL (ref 65–99)
Potassium: 4.4 mmol/L (ref 3.5–5.3)
Sodium: 140 mmol/L (ref 135–146)
Total Bilirubin: 0.4 mg/dL (ref 0.2–1.2)
Total Protein: 6.9 g/dL (ref 6.1–8.1)
eGFR: 116 mL/min/1.73m2 (ref >=60)

## 2023-10-29 LAB — CBC WITH DIFFERENTIAL/PLATELET
Absolute Lymphocytes: 2895 {cells}/uL (ref 850–3900)
Absolute Monocytes: 570 {cells}/uL (ref 200–950)
Basophils Absolute: 69 {cells}/uL (ref 0–200)
Basophils Relative: 0.9 %
Eosinophils Absolute: 293 {cells}/uL (ref 15–500)
Eosinophils Relative: 3.8 %
HCT: 39.7 % (ref 35.0–45.0)
Hemoglobin: 13 g/dL (ref 11.7–15.5)
MCH: 30 pg (ref 27.0–33.0)
MCHC: 32.7 g/dL (ref 32.0–36.0)
MCV: 91.5 fL (ref 80.0–100.0)
MPV: 10.3 fL (ref 7.5–12.5)
Monocytes Relative: 7.4 %
Neutro Abs: 3873 {cells}/uL (ref 1500–7800)
Neutrophils Relative %: 50.3 %
Platelets: 294 Thousand/uL (ref 140–400)
RBC: 4.34 Million/uL (ref 3.80–5.10)
RDW: 12.5 % (ref 11.0–15.0)
Total Lymphocyte: 37.6 %
WBC: 7.7 Thousand/uL (ref 3.8–10.8)

## 2023-10-29 LAB — LIPID PANEL
Cholesterol: 197 mg/dL (ref <200)
HDL: 75 mg/dL (ref >=50)
LDL Cholesterol (Calc): 101 mg/dL — ABNORMAL HIGH
Non-HDL Cholesterol (Calc): 122 mg/dL (ref <130)
Total CHOL/HDL Ratio: 2.6 (calc) (ref <5.0)
Triglycerides: 114 mg/dL (ref <150)

## 2023-10-29 MED ORDER — CETIRIZINE HCL 10 MG PO TABS: 10.0000 mg | ORAL_TABLET | Freq: Every day | ORAL | 3 refills | Status: DC

## 2023-10-29 MED ORDER — MONTELUKAST SODIUM 10 MG PO TABS: 10.0000 mg | ORAL_TABLET | Freq: Every day | ORAL | 3 refills | Status: DC

## 2023-10-29 NOTE — Progress Notes (Signed)
 Established Patient Office Visit  Subjective   Patient ID: Autumn  Todd, female    DOB: 12-03-1985  Age: 38 y.o. MRN: [id number]  Chief Complaint  Patient presents with   Medical Management of Chronic Issues    6 month recheck    HPI  Patient is here for follow up. Patient has been feeling much better since our last virtual visit.  Discussed the use of AI scribe software for clinical note transcription with the patient, who gave verbal consent to proceed.  History of Present Illness Autumn  Todd is a 38 year old female who presents with suspected lactose intolerance.  Symptoms began four years ago with a burning sensation and occasional pain in the upper chest, initially thought to be heartburn. She suspects lactose intolerance after tracking her food intake and symptoms. Lactaid tablets alleviate her symptoms. She tolerates Fairlife milk well and can consume small amounts of dairy, such as cheese on tacos, without significant issues. Larger quantities or multiple dairy products exacerbate symptoms. Ice cream is generally well-tolerated but can sometimes cause symptoms.  She takes Zyrtec  and Singulair  for allergies and is on birth control. She does not use steroid creams. Eggs do not cause issues and are not considered part of her dairy intolerance.  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   Asthma, mild intermittent 10/11/2014   Past Medical History:  Diagnosis Date   Allergy    Anemia 07/24/2015   Anxiety 03/11/2018   Anxiety 03/11/2018   Asthma    not in four years   De Quervain's tenosynovitis, left 06/06/2015   Eczema of right hand 06/06/2015   Gastroesophageal  reflux disease 04/10/2020   Sinus tachycardia    Past Surgical History:  Procedure Laterality Date   CESAREAN SECTION N/A 04/27/2015   Procedure: CESAREAN SECTION;  Surgeon: Bebe Furry, MD;  Location: [facility] ORS;  Service: Obstetrics;  Laterality: N/A;   CESAREAN SECTION N/A 06/06/2019   Procedure: CESAREAN SECTION;  Surgeon: Janit Alm Agent, MD;  Location: [facility] OR[facility];  Service: Obstetrics;  Laterality: N/A;   WISDOM TOOTH EXTRACTION     Social History   Tobacco Use   Smoking status: Never   Smokeless tobacco: Never  Vaping Use   Vaping status: Never Used  Substance Use Topics   Alcohol use: No    Alcohol/week: 0.0 standard drinks of alcohol   Drug use: No   Social History   Socioeconomic History   Marital status: Married    Spouse name: Not on file   Number of children: 0   Years of education: Not on file   Highest education level: Bachelor's degree (e.g., BA, AB, BS)  Occupational History   Occupation: Teacher    Comment: [employer]  Tobacco Use   Smoking status: Never   Smokeless tobacco: Never  Vaping Use   Vaping status: Never Used  Substance and Sexual Activity   Alcohol use: No    Alcohol/week: 0.0 standard drinks of alcohol   Drug use: No   Sexual activity: Yes    Partners: Male    Birth control/protection: Pill  Other Topics Concern   Not on file  Social History Narrative   Not on file   Social Drivers of Health   Financial  Resource Strain: Low Risk  (10/28/2023)   Overall Financial Resource Strain (CARDIA)    Difficulty of Paying Living Expenses: Not hard at all  Food Insecurity: No Food Insecurity (10/28/2023)   Hunger Vital Sign    Worried About Running Out of Food in the Last Year: Never true    Ran Out of Food in the Last Year: Never true  Transportation Needs: No Transportation Needs (10/28/2023)   PRAPARE - Administrator, Civil Service (Medical): No    Lack of Transportation (Non-Medical): No  Physical Activity: Sufficiently Active  (10/28/2023)   Exercise Vital Sign    Days of Exercise per Week: 4 days    Minutes of Exercise per Session: 40 min  Stress: No Stress Concern Present (10/28/2023)   [employer] of Occupational Health - Occupational Stress Questionnaire    Feeling of Stress: Not at all  Social Connections: Moderately Integrated (10/28/2023)   Social Connection and Isolation Panel    Frequency of Communication with Friends and Family: Once a week    Frequency of Social Gatherings with Friends and Family: Once a week    Attends Religious Services: More than 4 times per year    Active Member of [employer] or Organizations: Yes    Attends Engineer, structural: More than 4 times per year    Marital Status: Married  Catering manager Violence: Not on file   Family Status  Relation Name Status   Mother  Alive   Father  Alive   Sister  Alive   Brother  Alive   MGM  Alive   MGF  Deceased   PGM  Alive   PGF  Deceased  No partnership data on file   Family History  Problem Relation Age of Onset   Cancer Maternal Grandfather        unsure of type   No Known Allergies    Review of Systems  All other systems reviewed and are negative.     Objective:     BP 120/70 (Cuff Size: Large)   Pulse 100   Temp 97.9 F (36.6 C) (Oral)   Resp 18   Ht 5' (1.524 m)   Wt 158 lb 12.8 oz (72 kg)   LMP  (LMP Unknown)   SpO2 96%   BMI 31.01 kg/m  BP Readings from Last 3 Encounters:  10/29/23 120/70  08/13/23 129/79  12/18/22 132/78   Wt Readings from Last 3 Encounters:  10/29/23 158 lb 12.8 oz (72 kg)  08/13/23 159 lb (72.1 kg)  12/18/22 161 lb (73 kg)      Physical Exam Constitutional:      Appearance: Normal appearance.  HENT:     Head: Normocephalic and atraumatic.     Mouth/Throat:     Mouth: Mucous membranes are moist.     Pharynx: Oropharynx is clear.  Eyes:     Extraocular Movements: Extraocular movements intact.     Conjunctiva/sclera: Conjunctivae normal.     Pupils: Pupils  are equal, round, and reactive to light.  Neck:     Comments: No thyromegaly Cardiovascular:     Rate and Rhythm: Normal rate and regular rhythm.  Pulmonary:     Effort: Pulmonary effort is normal.     Breath sounds: Normal breath sounds.  Musculoskeletal:     Cervical back: No tenderness.     Right lower leg: No edema.     Left lower leg: No edema.  Lymphadenopathy:     Cervical:  No cervical adenopathy.  Skin:    General: Skin is warm and dry.  Neurological:     General: No focal deficit present.     Mental Status: She is alert. Mental status is at baseline.  Psychiatric:        Mood and Affect: Mood normal.        Behavior: Behavior normal.      No results found for any visits on 10/29/23.  Last CBC Lab Results  Component Value Date   WBC 10.5 06/06/2019   HGB 11.4 (L) 06/06/2019   HCT 35.2 (L) 06/06/2019   MCV 88.9 06/06/2019   MCH 28.8 06/06/2019   RDW 14.8 06/06/2019   PLT 252 06/06/2019   Last metabolic panel Lab Results  Component Value Date   GLUCOSE 97 02/13/2018   NA 139 02/13/2018   K 4.1 02/13/2018   CL 105 02/13/2018   CO2 26 02/13/2018   BUN 11 02/13/2018   CREATININE 0.76 02/13/2018   GFRNONAA 106 02/13/2018   CALCIUM 9.7 02/13/2018   PROT 7.2 02/13/2018   BILITOT 0.4 02/13/2018   AST 15 02/13/2018   ALT 13 02/13/2018   Last lipids Lab Results  Component Value Date   CHOL 166 04/04/2020   HDL 65 04/04/2020   LDLCALC 88 04/04/2020   TRIG 70 04/04/2020   CHOLHDL 2.6 04/04/2020   Last hemoglobin A1c Lab Results  Component Value Date   HGBA1C 4.9 11/23/2018   Last thyroid  functions Lab Results  Component Value Date   TSH 1.370 11/23/2018   T4TOTAL 7.5 10/08/2018   Last vitamin D No results found for: 25OHVITD2, 25OHVITD3, VD25OH Last vitamin B12 and Folate No results found for: VITAMINB12, FOLATE    The ASCVD Risk score (Arnett DK, et al., 2019) failed to calculate for the following reasons:   The 2019 ASCVD risk  score is only valid for ages 67 to 62    Assessment & Plan:   Assessment & Plan Lactose Intolerance Symptoms consistent with lactose intolerance managed with dietary changes and lactase enzyme tablets. Enzyme deficiency discussed as cause. No food allergy panel needed. - Continue dietary modifications to limit lactose intake. - Continue use of lactase enzyme tablets as needed. - Consider probiotics to regulate gut health.  General Health Maintenance/Allergies Routine health maintenance discussed. Up to date on tetanus vaccination and PAP smears. - Order routine labs: CBC, kidney function tests, cholesterol panel. - Refill Zyrtec  and Singulair  for one year. - Ensure birth control managed by gynecologist. - Confirm pharmacy as [facility] on [address] for refills.  - CBC w/Diff/Platelet - Comprehensive Metabolic Panel (CMET) - Lipid Profile - cetirizine  (ZYRTEC ) 10 MG tablet; Take 1 tablet (10 mg total) by mouth daily.  Dispense: 90 tablet; Refill: 3 - montelukast  (SINGULAIR ) 10 MG tablet; Take 1 tablet (10 mg total) by mouth at bedtime.  Dispense: 90 tablet; Refill: 3    Return in about 1 year (around 10/28/2024).    Sharyle Fischer, DO

## 2023-10-30 ENCOUNTER — Ambulatory Visit: Payer: Self-pay | Admitting: Internal Medicine

## 2023-12-25 ENCOUNTER — Other Ambulatory Visit: Payer: Self-pay | Admitting: Internal Medicine

## 2023-12-25 DIAGNOSIS — Z9109 Other allergy status, other than to drugs and biological substances: Secondary | ICD-10-CM

## 2023-12-25 NOTE — Telephone Encounter (Signed)
 Requested Prescriptions  Refused Prescriptions Disp Refills   montelukast  (SINGULAIR ) 10 MG tablet [Pharmacy Med Name: MONTELUKAST  10MG  TABLETS] 90 tablet 3    Sig: TAKE 1 TABLET(10 MG) BY MOUTH AT BEDTIME     Pulmonology:  Leukotriene Inhibitors Passed - 12/25/2023  5:25 PM      Passed - Valid encounter within last 12 months    Recent Outpatient Visits           1 month ago Annual physical exam   [facility] Bernardo Fend, DO       Future Appointments             In 10 months Bernardo Fend, DO [facility] [place]

## 2024-05-04 ENCOUNTER — Other Ambulatory Visit: Payer: Self-pay

## 2024-05-04 ENCOUNTER — Encounter: Payer: Self-pay | Admitting: Internal Medicine

## 2024-05-04 ENCOUNTER — Ambulatory Visit (INDEPENDENT_AMBULATORY_CARE_PROVIDER_SITE_OTHER): Admitting: Internal Medicine

## 2024-05-04 VITALS — BP 112/80 | HR 84 | Temp 98.2°F | Resp 16 | Ht 59.5 in | Wt 154.2 lb

## 2024-05-04 DIAGNOSIS — Z9109 Other allergy status, other than to drugs and biological substances: Secondary | ICD-10-CM

## 2024-05-04 DIAGNOSIS — Z3041 Encounter for surveillance of contraceptive pills: Secondary | ICD-10-CM

## 2024-05-04 MED ORDER — CETIRIZINE HCL 10 MG PO TABS: 10.0000 mg | ORAL_TABLET | Freq: Every day | ORAL | 3 refills | Status: AC

## 2024-05-04 MED ORDER — MONTELUKAST SODIUM 10 MG PO TABS: 10.0000 mg | ORAL_TABLET | Freq: Every day | ORAL | 3 refills | Status: AC

## 2024-05-04 NOTE — Progress Notes (Signed)
 "  Established Patient Office Visit  Subjective   Patient ID: Autumn  Todd, female    DOB: 23-May-1985  Age: 39 y.o. MRN: [id number]  Chief Complaint  Patient presents with   Allergies    Medication refills    HPI  Patient is here for follow up and refills.   Discussed the use of AI scribe software for clinical note transcription with the patient, who gave verbal consent to proceed.  History of Present Illness Autumn  Todd is a 39 year old female who presents for medication refills and contraception management.  She uses Zyrtec  and Singulair  for allergies and had a refill problem, as one of the bottles now shows no refills despite a 1-year supply being filled in July. She needs refills today to avoid interruption of therapy.  Her birth control is typically managed by her gynecologist, and she just used her last refill. Her prior gynecology provider left, she is considering switching practices for more consistent care, and she has not yet been able to schedule her next visit. She is likely due for a Pap smear, with the last one in 2021, and is interested in consolidating contraception and GYN care in primary care if possible.  She received a flu vaccine in October at school and has no other current medical concerns relevant to today's visit.   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   Asthma, mild intermittent 10/11/2014   Past Medical History:  Diagnosis Date   Allergy    Anemia 07/24/2015   Anxiety 03/11/2018   Anxiety 03/11/2018   Asthma    not in four years   De Quervain's tenosynovitis, left 06/06/2015   Eczema of right hand 06/06/2015   Gastroesophageal reflux disease 04/10/2020   Sinus tachycardia    Past Surgical  History:  Procedure Laterality Date   CESAREAN SECTION N/A 04/27/2015   Procedure: CESAREAN SECTION;  Surgeon: Bebe Furry, MD;  Location: [facility] OR[facility];  Service: Obstetrics;  Laterality: N/A;   CESAREAN SECTION N/A 06/06/2019   Procedure: CESAREAN SECTION;  Surgeon: Janit Alm Agent, MD;  Location: [facility] OR[facility];  Service: Obstetrics;  Laterality: N/A;   WISDOM TOOTH EXTRACTION     Social History[1] Social History   Socioeconomic History   Marital status: Married    Spouse name: Not on file   Number of children: 0   Years of education: Not on file   Highest education level: Bachelor's degree (e.g., BA, AB, BS)  Occupational History   Occupation: Teacher    Comment: [employer]  Tobacco Use   Smoking status: Never   Smokeless tobacco: Never  Vaping Use   Vaping status: Never Used  Substance and Sexual Activity   Alcohol use: No    Alcohol/week: 0.0 standard drinks of alcohol   Drug use: No   Sexual activity: Yes    Partners: Male    Birth control/protection: Pill  Other Topics Concern   Not on file  Social History Narrative   Not on file   Social Drivers of Health   Tobacco Use: Low Risk (05/04/2024)   Patient History    Smoking Tobacco Use: Never    Smokeless Tobacco Use: Never    Passive Exposure: Not on file  Financial Resource Strain: Low Risk (04/30/2024)   Overall Financial Resource Strain (CARDIA)  Difficulty of Paying Living Expenses: Not hard at all  Food Insecurity: No Food Insecurity (04/30/2024)   Epic    Worried About Programme Researcher, Broadcasting/film/video in the Last Year: Never true    Ran Out of Food in the Last Year: Never true  Transportation Needs: No Transportation Needs (04/30/2024)   Epic    Lack of Transportation (Medical): No    Lack of Transportation (Non-Medical): No  Physical Activity: Sufficiently Active (04/30/2024)   Exercise Vital Sign    Days of Exercise per Week: 4 days    Minutes of Exercise per Session: 40 min  Stress: No Stress Concern Present (04/30/2024)    [employer] of Occupational Health - Occupational Stress Questionnaire    Feeling of Stress: Only a little  Social Connections: Unknown (04/30/2024)   Social Connection and Isolation Panel    Frequency of Communication with Friends and Family: Never    Frequency of Social Gatherings with Friends and Family: Patient declined    Attends Religious Services: More than 4 times per year    Active Member of [employer] or Organizations: Yes    Attends Banker Meetings: More than 4 times per year    Marital Status: Married  Catering Manager Violence: Not on file  Depression (PHQ2-9): Low Risk (12/18/2022)   Depression (PHQ2-9)    PHQ-2 Score: 0  Alcohol Screen: Not on file  Housing: Low Risk (04/30/2024)   Epic    Unable to Pay for Housing in the Last Year: No    Number of Times Moved in the Last Year: 0    Homeless in the Last Year: No  Utilities: Not on file  Health Literacy: Not on file   Family Status  Relation Name Status   Mother  Alive   Father  Alive   Sister  Alive   Brother  Alive   MGM  Alive   MGF  Deceased   PGM  Alive   PGF  Deceased  No partnership data on file   Family History  Problem Relation Age of Onset   Cancer Maternal Grandfather        unsure of type   Allergies[2]    Review of Systems  All other systems reviewed and are negative.     Objective:     BP 112/80   Pulse 84   Temp 98.2 F (36.8 C) (Oral)   Resp 16   Ht 4' 11.5 (1.511 m)   Wt 154 lb 3.2 oz (69.9 kg)   SpO2 97%   BMI 30.62 kg/m  BP Readings from Last 3 Encounters:  05/04/24 112/80  10/29/23 120/70  08/13/23 129/79   Wt Readings from Last 3 Encounters:  05/04/24 154 lb 3.2 oz (69.9 kg)  10/29/23 158 lb 12.8 oz (72 kg)  08/13/23 159 lb (72.1 kg)      Physical Exam Constitutional:      Appearance: Normal appearance.  HENT:     Head: Normocephalic and atraumatic.  Eyes:     Conjunctiva/sclera: Conjunctivae normal.  Cardiovascular:     Rate and Rhythm:  Normal rate and regular rhythm.  Pulmonary:     Effort: Pulmonary effort is normal.     Breath sounds: Normal breath sounds.  Skin:    General: Skin is warm and dry.  Neurological:     General: No focal deficit present.     Mental Status: She is alert. Mental status is at baseline.  Psychiatric:  Mood and Affect: Mood normal.        Behavior: Behavior normal.      No results found for any visits on 05/04/24.  Last CBC Lab Results  Component Value Date   WBC 7.7 10/29/2023   HGB 13.0 10/29/2023   HCT 39.7 10/29/2023   MCV 91.5 10/29/2023   MCH 30.0 10/29/2023   RDW 12.5 10/29/2023   PLT 294 10/29/2023   Last metabolic panel Lab Results  Component Value Date   GLUCOSE 90 10/29/2023   NA 140 10/29/2023   K 4.4 10/29/2023   CL 105 10/29/2023   CO2 24 10/29/2023   BUN 13 10/29/2023   CREATININE 0.70 10/29/2023   EGFR 116 10/29/2023   CALCIUM 9.6 10/29/2023   PROT 6.9 10/29/2023   BILITOT 0.4 10/29/2023   AST 16 10/29/2023   ALT 19 10/29/2023   Last lipids Lab Results  Component Value Date   CHOL 197 10/29/2023   HDL 75 10/29/2023   LDLCALC 101 (H) 10/29/2023   TRIG 114 10/29/2023   CHOLHDL 2.6 10/29/2023   Last hemoglobin A1c Lab Results  Component Value Date   HGBA1C 4.9 11/23/2018   Last thyroid  functions Lab Results  Component Value Date   TSH 1.370 11/23/2018   T4TOTAL 7.5 10/08/2018   Last vitamin D No results found for: 25OHVITD2, 25OHVITD3, VD25OH Last vitamin B12 and Folate No results found for: VITAMINB12, FOLATE    The ASCVD Risk score (Arnett DK, et al., 2019) failed to calculate for the following reasons:   The 2019 ASCVD risk score is only valid for ages 24 to 75    Assessment & Plan:   Assessment & Plan Environmental allergies Managed with Zyrtec  and Singulair . She ran out of medication despite a previous year's supply filled in July. - Refilled Zyrtec  and Singulair  for one year at [facility] on [address].  Contraceptive management She is considering switching gynecologists due to dissatisfaction with current provider. Prefers to manage contraception through a new provider. - Placed referral to [facility] for contraceptive management and Pap smear. - Advised to contact if appointment is delayed and birth control refill is needed.  - cetirizine  (ZYRTEC ) 10 MG tablet; Take 1 tablet (10 mg total) by mouth daily.  Dispense: 90 tablet; Refill: 3 - montelukast  (SINGULAIR ) 10 MG tablet; Take 1 tablet (10 mg total) by mouth at bedtime.  Dispense: 90 tablet; Refill: 3 - Ambulatory referral to Gynecology   Return for already scheduled.    Sharyle Fischer, DO    [1]  Social History Tobacco Use   Smoking status: Never   Smokeless tobacco: Never  Vaping Use   Vaping status: Never Used  Substance Use Topics   Alcohol use: No    Alcohol/week: 0.0 standard drinks of alcohol   Drug use: No  [2] No Known Allergies  "

## 2024-10-29 ENCOUNTER — Ambulatory Visit: Admitting: Internal Medicine
